# Patient Record
Sex: Female | Born: 1970 | Race: White | Hispanic: No | Marital: Single | Smoking: Former smoker
Health system: Southern US, Community
[De-identification: ages and names within clinical notes are randomized; demographics above are authoritative.]

## PROBLEM LIST (undated history)

## (undated) DIAGNOSIS — F419 Anxiety disorder, unspecified: Secondary | ICD-10-CM

## (undated) DIAGNOSIS — G4730 Sleep apnea, unspecified: Secondary | ICD-10-CM

## (undated) DIAGNOSIS — J449 Chronic obstructive pulmonary disease, unspecified: Secondary | ICD-10-CM

## (undated) DIAGNOSIS — K219 Gastro-esophageal reflux disease without esophagitis: Secondary | ICD-10-CM

## (undated) DIAGNOSIS — K5732 Diverticulitis of large intestine without perforation or abscess without bleeding: Secondary | ICD-10-CM

## (undated) DIAGNOSIS — I10 Essential (primary) hypertension: Secondary | ICD-10-CM

## (undated) DIAGNOSIS — M1990 Unspecified osteoarthritis, unspecified site: Secondary | ICD-10-CM

## (undated) DIAGNOSIS — E039 Hypothyroidism, unspecified: Secondary | ICD-10-CM

## (undated) DIAGNOSIS — J45909 Unspecified asthma, uncomplicated: Secondary | ICD-10-CM

## (undated) DIAGNOSIS — K449 Diaphragmatic hernia without obstruction or gangrene: Secondary | ICD-10-CM

## (undated) HISTORY — DX: Essential (primary) hypertension: I10

## (undated) HISTORY — DX: Diaphragmatic hernia without obstruction or gangrene: K44.9

## (undated) HISTORY — PX: ABDOMINAL HYSTERECTOMY: SHX81

## (undated) HISTORY — DX: Morbid (severe) obesity due to excess calories: E66.01

## (undated) HISTORY — PX: KNEE ARTHROSCOPY: SUR90

## (undated) HISTORY — PX: INNER EAR SURGERY: SHX679

## (undated) HISTORY — PX: COLONOSCOPY: SHX174

## (undated) HISTORY — PX: TONSILLECTOMY AND ADENOIDECTOMY: SUR1326

## (undated) HISTORY — DX: Sleep apnea, unspecified: G47.30

## (undated) HISTORY — PX: NASAL SINUS SURGERY: SHX719

## (undated) HISTORY — DX: Gastro-esophageal reflux disease without esophagitis: K21.9

## (undated) HISTORY — PX: HERNIA REPAIR: SHX51

---

## 2012-05-15 ENCOUNTER — Encounter (INDEPENDENT_AMBULATORY_CARE_PROVIDER_SITE_OTHER): Payer: Self-pay | Admitting: General Surgery

## 2012-05-15 ENCOUNTER — Ambulatory Visit (INDEPENDENT_AMBULATORY_CARE_PROVIDER_SITE_OTHER): Payer: 59 | Admitting: General Surgery

## 2012-05-15 DIAGNOSIS — K219 Gastro-esophageal reflux disease without esophagitis: Secondary | ICD-10-CM

## 2012-05-15 DIAGNOSIS — I10 Essential (primary) hypertension: Secondary | ICD-10-CM

## 2012-05-15 DIAGNOSIS — R635 Abnormal weight gain: Secondary | ICD-10-CM

## 2012-05-15 LAB — CBC WITH DIFFERENTIAL/PLATELET
Hemoglobin: 14.7 g/dL (ref 12.0–15.0)
Lymphocytes Relative: 20 % (ref 12–46)
Lymphs Abs: 2.3 10*3/uL (ref 0.7–4.0)
Monocytes Relative: 6 % (ref 3–12)
Neutro Abs: 8.3 10*3/uL — ABNORMAL HIGH (ref 1.7–7.7)
Neutrophils Relative %: 74 % (ref 43–77)
Platelets: 289 10*3/uL (ref 150–400)
RBC: 4.97 MIL/uL (ref 3.87–5.11)
WBC: 11.3 10*3/uL — ABNORMAL HIGH (ref 4.0–10.5)

## 2012-05-15 NOTE — Progress Notes (Signed)
Patient ID: [name], female   DOB: June 15, 1971, 41 y.o.   MRN: [id number]  Chief Complaint  Patient presents with  . Other    new bariatric- gastric sleeve initial    HPI [name] is a 41 y.o. female.   HPI This patient presents for her initial weight loss surgery evaluation. She has attended her information session and is interested in a sleeve gastrectomy. She has a BMI of 43 and a history of hypertension, knee pain, and herniated discs in her back. She also has gastroesophageal reflux. She has her heartburn every night when she lies flat. She was taking Nexium which worked well for her but her insurance wouldn't pay any more and so she has not been taking this. Currently she's been taking TUMS and some milk and this seems to do well for her reflux. She is troubled with her weight since 1997 after the birth of her child and has tried several over-the-counter medications for appetite control and has been followed by her doctor for the last 3 years for this as well. She has been off and on phentermine which has not really worked well for her. She says she works at twice a week at [employer] on the bicycle and elliptical and she says she walks 2 miles per day. She is not working currently she says she is disabled due to her back.  Past Medical History  Diagnosis Date  . GERD (gastroesophageal reflux disease)   . Hypertension   . Sleep apnea     Past Surgical History  Procedure Date  . Abdominal hysterectomy   . Knee arthroscopy     left  . Inner ear surgery     Family History  Problem Relation Age of Onset  . Cancer Father     lymphoma  . Cancer Paternal Grandmother     breast    Social History History  Substance Use Topics  . Smoking status: Former Games developer  . Smokeless tobacco: Not on file  . Alcohol Use: No    No Known Allergies  Current Outpatient Prescriptions  Medication Sig Dispense Refill  . esomeprazole (NEXIUM) 40 MG capsule Take 40 mg by mouth daily  before breakfast.      . fluticasone (VERAMYST) 27.5 MCG/SPRAY nasal spray Place 2 sprays into the nose daily.      . hydrochlorothiazide (HYDRODIURIL) 25 MG tablet Take 25 mg by mouth daily.      Marland Kitchen losartan (COZAAR) 25 MG tablet Take 25 mg by mouth daily.      . meloxicam (MOBIC) 15 MG tablet Take 15 mg by mouth daily.      Marland Kitchen oxyCODONE (ROXICODONE) 15 MG immediate release tablet Take 15 mg by mouth every 4 (four) hours as needed.      Marland Kitchen oxymorphone (OPANA ER) 30 MG 12 hr tablet Take 30 mg by mouth every 12 (twelve) hours.      . phentermine 37.5 MG capsule Take 37.5 mg by mouth every morning.        Review of Systems Review of Systems All other review of systems negative or noncontributory except as stated in the HPI  Blood pressure 150/102, pulse 84, temperature 96.9 F (36.1 C), temperature source Temporal, resp. rate 18, height 6\' 1"  (1.854 m), weight 333 lb 6.4 oz (151.229 kg).  Physical Exam Physical Exam Physical Exam  Vitals reviewed. Constitutional: He is oriented to person, place, and time. He appears well-developed and well-nourished. No distress.  HENT:  Head: Normocephalic and atraumatic.  Mouth/Throat: No oropharyngeal exudate.  Eyes: Conjunctivae and EOM are normal. Pupils are equal, round, and reactive to light. Right eye exhibits no discharge. Left eye exhibits no discharge. No scleral icterus.  Neck: Normal range of motion. No tracheal deviation present.  Cardiovascular: Normal rate, regular rhythm and normal heart sounds.   Pulmonary/Chest: Effort normal and breath sounds normal. No stridor. No respiratory distress. He has no wheezes. He has no rales. He exhibits no tenderness.  Abdominal: Soft. Bowel sounds are normal. He exhibits no distension and no mass. There is no tenderness. There is no rebound and no guarding.  Musculoskeletal: Normal range of motion. He exhibits no edema and no tenderness.  Neurological: He is alert and oriented to person, place, and time.    Skin: Skin is warm and dry. No rash noted. He is not diaphoretic. No erythema. No pallor.  Psychiatric: He has a normal mood and affect. His behavior is normal. Judgment and thought content normal.    Data Reviewed   Assessment    Morbid obesity with BMI of 43 and comorbidities of hypertension, back pain, knee pain, and GERD I discussed with her all the surgical options including Laband, vertical sleve gastrectomy, and Roux-en-Y gastric bypass. I think that she would qualify for any of these surgeries, however, I did discuss the increased risk of reflux or worsening reflux with a sleeve gastrectomy. She expressed understanding of this. She is still interested in pursuing sleeve gastrectomy and I explained that if her reflux is worsened and not controlled with medications postoperatively, then she will need conversion to gastric bypass. She expressed understanding of this.The risks of infection, bleeding, pain, scarring, weight regain, too little or too much weight loss, vitamin deficiencies and need for lifelong vitamin supplementation, hair loss, need for protein supplementation, leaks, stricture, reflux, food intolerance, need for reoperation and conversion to roux Y gastric bypass, need for open surgery, injury to spleen or surrounding structures, DVT's, PE, and death again discussed with the patient and the patient expressed understanding and desires to proceed with laparoscopic vertical sleeve gastrectomy, possible open, intraoperative endoscopy.She would like to proceed with sleeve gastrectomy. We spent 45 minutes reviewing the information.     Plan    We will check labs, UGI, nutrition and psychology evaluations.       Lodema Pilot DAVID 05/15/2012, 5:27 PM

## 2012-05-16 LAB — LIPID PANEL
HDL: 46 mg/dL (ref >39)
LDL Cholesterol: 115 mg/dL — ABNORMAL HIGH (ref 0–99)
Total CHOL/HDL Ratio: 4.2 Ratio
VLDL: 32 mg/dL (ref 0–40)

## 2012-05-16 LAB — TSH: TSH: 2.255 u[IU]/mL (ref 0.350–4.500)

## 2012-05-16 LAB — COMPREHENSIVE METABOLIC PANEL
ALT: 21 U/L (ref 0–35)
Albumin: 4.4 g/dL (ref 3.5–5.2)
CO2: 26 mEq/L (ref 19–32)
Glucose, Bld: 70 mg/dL (ref 70–99)
Potassium: 4.5 mEq/L (ref 3.5–5.3)
Sodium: 140 mEq/L (ref 135–145)
Total Protein: 7.5 g/dL (ref 6.0–8.3)

## 2012-05-16 LAB — T4: T4, Total: 10.6 ug/dL (ref 5.0–12.5)

## 2012-05-19 ENCOUNTER — Telehealth (INDEPENDENT_AMBULATORY_CARE_PROVIDER_SITE_OTHER): Payer: Self-pay

## 2012-05-20 ENCOUNTER — Telehealth (INDEPENDENT_AMBULATORY_CARE_PROVIDER_SITE_OTHER): Payer: Self-pay

## 2012-05-20 NOTE — Telephone Encounter (Signed)
H-pylori results given to patient, reviewed with Dr. Johna Sheriff- Prev Pac called in to [facility] on [address] [facility] [address]. [place], [place].  Patient will pick up RX after UGI on 05/21/12. Prev- Pac 1 po, b.i.d, 14 day's, #28 0 refills-Dr. Johna Sheriff authorized (Dr. [name] on vac).

## 2012-05-20 NOTE — Telephone Encounter (Signed)
Patient called regarding an UGI that is scheduled for 05/21/12, patient states she had an UGI last year at [facility] in [place].  Records requested on 05/15/12, patient called our office back and said she did not have an UGI it was and EGD.  Patient scheduled for UGI on 05/21/12 and has been instructed to keep this appointment.

## 2012-05-21 ENCOUNTER — Ambulatory Visit (HOSPITAL_COMMUNITY)
Admission: RE | Admit: 2012-05-21 | Discharge: 2012-05-21 | Disposition: A | Discharge status: Home / Self Care | Payer: Medicare Other | Source: Ambulatory Visit | Attending: General Surgery | Admitting: General Surgery

## 2012-05-21 DIAGNOSIS — G4730 Sleep apnea, unspecified: Secondary | ICD-10-CM | POA: Insufficient documentation

## 2012-05-21 DIAGNOSIS — M25569 Pain in unspecified knee: Secondary | ICD-10-CM | POA: Insufficient documentation

## 2012-05-21 DIAGNOSIS — K219 Gastro-esophageal reflux disease without esophagitis: Secondary | ICD-10-CM

## 2012-05-21 DIAGNOSIS — K449 Diaphragmatic hernia without obstruction or gangrene: Secondary | ICD-10-CM | POA: Insufficient documentation

## 2012-05-21 DIAGNOSIS — I10 Essential (primary) hypertension: Secondary | ICD-10-CM

## 2012-05-21 DIAGNOSIS — Z6841 Body Mass Index (BMI) 40.0 and over, adult: Secondary | ICD-10-CM | POA: Insufficient documentation

## 2012-05-21 DIAGNOSIS — R635 Abnormal weight gain: Secondary | ICD-10-CM

## 2012-05-21 DIAGNOSIS — M549 Dorsalgia, unspecified: Secondary | ICD-10-CM | POA: Insufficient documentation

## 2012-05-26 ENCOUNTER — Other Ambulatory Visit (INDEPENDENT_AMBULATORY_CARE_PROVIDER_SITE_OTHER): Payer: Self-pay

## 2012-05-26 ENCOUNTER — Other Ambulatory Visit (INDEPENDENT_AMBULATORY_CARE_PROVIDER_SITE_OTHER): Payer: Self-pay | Admitting: General Surgery

## 2012-05-26 DIAGNOSIS — A048 Other specified bacterial intestinal infections: Secondary | ICD-10-CM

## 2012-05-26 MED ORDER — AMOXICILL-CLARITHRO-LANSOPRAZ PO MISC: Freq: Two times a day (BID) | ORAL | Status: DC

## 2012-05-29 ENCOUNTER — Telehealth (INDEPENDENT_AMBULATORY_CARE_PROVIDER_SITE_OTHER): Payer: Self-pay

## 2012-05-29 NOTE — Telephone Encounter (Signed)
Lab order slip mailed to patient, [name] need's to have repeat H-Pylori Test 1 week after completion of Prev-Pac.

## 2012-06-04 ENCOUNTER — Encounter: Payer: 59 | Attending: General Surgery | Admitting: *Deleted

## 2012-06-04 DIAGNOSIS — Z713 Dietary counseling and surveillance: Secondary | ICD-10-CM | POA: Insufficient documentation

## 2012-06-04 DIAGNOSIS — Z01818 Encounter for other preprocedural examination: Secondary | ICD-10-CM | POA: Insufficient documentation

## 2012-06-04 NOTE — Progress Notes (Signed)
  Pre-Op Assessment Visit:  Pre-Operative Gastric Sleeve Surgery  Medical Nutrition Therapy:  Appt start time: 1500   End time:  1600.  Patient was seen on 06/04/12 for Pre-Operative Gastric Sleeve Nutrition Assessment. Assessment and letter of approval faxed to [facility] Bariatric Surgery Program coordinator on 06/05/2012.  Approval letter sent to [facility] Scan center and will be available in the chart under the media tab.  TANITA  BODY COMP RESULTS  06/04/12   %Fat 53.2%   Fat Mass (lbs) 174.0   Fat Free Mass (lbs) 153.5   Total Body Water (lbs) 112.5   Handouts given during visit include:  Pre-Op Goals   Bariatric Surgery Protein Shakes  Patient to call for Pre-Op and Post-Op Nutrition Education at [facility] when surgery is scheduled.

## 2012-06-04 NOTE — Patient Instructions (Addendum)
   Follow Pre-Op Nutrition Goals to prepare for Gastric Sleeve Surgery.   Call the [facility] at [phone] once you have been given your surgery date to enrolled in the Pre-Op Nutrition Class. You will need to attend this nutrition class 3-4 weeks prior to your surgery.  

## 2012-06-05 ENCOUNTER — Encounter: Payer: Self-pay | Admitting: *Deleted

## 2012-06-09 NOTE — Telephone Encounter (Signed)
error 

## 2012-06-12 ENCOUNTER — Other Ambulatory Visit (INDEPENDENT_AMBULATORY_CARE_PROVIDER_SITE_OTHER): Payer: Self-pay | Admitting: General Surgery

## 2012-06-13 LAB — H. PYLORI ANTIBODY, IGG: H Pylori IgG: 7.03 {ISR} — ABNORMAL HIGH

## 2012-06-17 ENCOUNTER — Telehealth (INDEPENDENT_AMBULATORY_CARE_PROVIDER_SITE_OTHER): Payer: Self-pay

## 2012-06-17 NOTE — Telephone Encounter (Signed)
Breath Tek scheduled for patient on 06/25/12 @ 7:45 am.  Information with date & time given to Katie to notify patient and send paperwork regarding Breath Tek Test to patient.

## 2012-06-25 ENCOUNTER — Ambulatory Visit (HOSPITAL_COMMUNITY)
Admission: RE | Admit: 2012-06-25 | Discharge: 2012-06-25 | Disposition: A | Discharge status: Home / Self Care | Payer: PRIVATE HEALTH INSURANCE | Source: Ambulatory Visit | Attending: General Surgery | Admitting: General Surgery

## 2012-06-25 ENCOUNTER — Encounter (HOSPITAL_COMMUNITY)
Admission: RE | Disposition: A | Discharge status: Home / Self Care | Payer: Self-pay | Source: Ambulatory Visit | Attending: General Surgery

## 2012-06-25 HISTORY — PX: BREATH TEK H PYLORI: SHX5422

## 2012-06-25 SURGERY — BREATH TEST, FOR HELICOBACTER PYLORI

## 2012-06-26 ENCOUNTER — Encounter (HOSPITAL_COMMUNITY): Payer: Self-pay

## 2012-06-26 ENCOUNTER — Encounter (HOSPITAL_COMMUNITY): Payer: Self-pay | Admitting: General Surgery

## 2012-07-09 ENCOUNTER — Encounter (HOSPITAL_COMMUNITY): Payer: Self-pay | Admitting: Psychiatry

## 2012-07-09 ENCOUNTER — Ambulatory Visit (INDEPENDENT_AMBULATORY_CARE_PROVIDER_SITE_OTHER): Payer: Medicare Other | Admitting: Psychiatry

## 2012-07-09 VITALS — BP 128/74 | HR 88 | Ht 73.5 in | Wt 330.4 lb

## 2012-07-09 DIAGNOSIS — I10 Essential (primary) hypertension: Secondary | ICD-10-CM

## 2012-07-09 DIAGNOSIS — F064 Anxiety disorder due to known physiological condition: Secondary | ICD-10-CM

## 2012-07-09 DIAGNOSIS — K219 Gastro-esophageal reflux disease without esophagitis: Secondary | ICD-10-CM | POA: Insufficient documentation

## 2012-07-09 DIAGNOSIS — E669 Obesity, unspecified: Secondary | ICD-10-CM

## 2012-07-09 DIAGNOSIS — G4730 Sleep apnea, unspecified: Secondary | ICD-10-CM

## 2012-07-09 DIAGNOSIS — F0630 Mood disorder due to known physiological condition, unspecified: Secondary | ICD-10-CM
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
## 2012-11-29 ENCOUNTER — Ambulatory Visit: Payer: Self-pay | Admitting: *Deleted

## 2012-12-04 ENCOUNTER — Ambulatory Visit (INDEPENDENT_AMBULATORY_CARE_PROVIDER_SITE_OTHER): Payer: 59 | Admitting: General Surgery

## 2012-12-11 ENCOUNTER — Encounter (HOSPITAL_COMMUNITY): Payer: Self-pay | Admitting: Pharmacy Technician

## 2012-12-11 ENCOUNTER — Other Ambulatory Visit (HOSPITAL_COMMUNITY): Payer: Self-pay | Admitting: General Surgery

## 2012-12-11 NOTE — Patient Instructions (Addendum)
22 [name]  12/11/2012   Your procedure is scheduled on: 12-23-2012  Report to [facility] at 0515 AM.  Call this number if you have problems the morning of surgery [phone]   Remember:   Do not eat food or drink liquids :After Midnight.     Take these medicines the morning of surgery with A SIP OF WATER: gabapentin                               SEE [facility] PREPARING FOR SURGERY SHEET   Do not wear jewelry, make-up or nail polish.  Do not wear lotions, powders, or perfumes. You may wear deodorant.   Men may shave face and neck.  Do not bring valuables to the hospital.  Contacts, dentures or bridgework may not be worn into surgery.  Leave suitcase in the car. After surgery it may be brought to your room.  For patients admitted to the hospital, checkout time is 11:00 AM the day of discharge.   Patients discharged the day of surgery will not be allowed to drive home.  Name and phone number of your driver:  Special Instructions: N/A   Please read over the following fact sheets that you were given: MRSA Information.  Call Cain Sieve RN pre op nurse if needed [phone]    FAILURE TO FOLLOW THESE INSTRUCTIONS MAY RESULT IN THE CANCELLATION OF YOUR SURGERY. PATIENT SIGNATURE___________________________________________

## 2012-12-12 ENCOUNTER — Ambulatory Visit (HOSPITAL_COMMUNITY)
Admission: RE | Admit: 2012-12-12 | Discharge: 2012-12-12 | Disposition: A | Discharge status: Home / Self Care | Payer: Medicare Other | Source: Ambulatory Visit | Attending: General Surgery | Admitting: General Surgery

## 2012-12-12 ENCOUNTER — Encounter (INDEPENDENT_AMBULATORY_CARE_PROVIDER_SITE_OTHER): Payer: Self-pay | Admitting: General Surgery

## 2012-12-12 ENCOUNTER — Encounter (HOSPITAL_COMMUNITY)
Admission: RE | Admit: 2012-12-12 | Discharge: 2012-12-12 | Disposition: A | Discharge status: Home / Self Care | Payer: Medicare Other | Source: Ambulatory Visit | Attending: General Surgery | Admitting: General Surgery

## 2012-12-12 ENCOUNTER — Encounter (HOSPITAL_COMMUNITY): Payer: Self-pay

## 2012-12-12 ENCOUNTER — Ambulatory Visit (INDEPENDENT_AMBULATORY_CARE_PROVIDER_SITE_OTHER): Payer: 59 | Admitting: General Surgery

## 2012-12-12 VITALS — BP 138/90 | HR 84 | Temp 97.2°F | Ht 73.0 in | Wt 322.2 lb

## 2012-12-12 VITALS — BP 155/90 | HR 77 | Temp 97.9°F | Resp 18 | Ht 73.0 in | Wt 324.2 lb

## 2012-12-12 DIAGNOSIS — E669 Obesity, unspecified: Secondary | ICD-10-CM

## 2012-12-12 DIAGNOSIS — I10 Essential (primary) hypertension: Secondary | ICD-10-CM | POA: Insufficient documentation

## 2012-12-12 DIAGNOSIS — Z01812 Encounter for preprocedural laboratory examination: Secondary | ICD-10-CM | POA: Insufficient documentation

## 2012-12-12 LAB — CBC WITH DIFFERENTIAL/PLATELET
Eosinophils Absolute: 0 10*3/uL (ref 0.0–0.7)
Hemoglobin: 14.6 g/dL (ref 12.0–15.0)
Lymphocytes Relative: 25 % (ref 12–46)
Lymphs Abs: 3.2 10*3/uL (ref 0.7–4.0)
MCH: 29.4 pg (ref 26.0–34.0)
Monocytes Relative: 6 % (ref 3–12)
Neutrophils Relative %: 68 % (ref 43–77)
RBC: 4.97 MIL/uL (ref 3.87–5.11)

## 2012-12-12 LAB — COMPREHENSIVE METABOLIC PANEL
Alkaline Phosphatase: 59 U/L (ref 39–117)
BUN: 18 mg/dL (ref 6–23)
CO2: 26 mEq/L (ref 19–32)
Chloride: 104 mEq/L (ref 96–112)
GFR calc Af Amer: >90 mL/min (ref >90)
GFR calc non Af Amer: >90 mL/min (ref >90)
Glucose, Bld: 93 mg/dL (ref 70–99)
Potassium: 4.8 mEq/L (ref 3.5–5.1)
Total Bilirubin: 0.3 mg/dL (ref 0.3–1.2)
Total Protein: 7.5 g/dL (ref 6.0–8.3)

## 2012-12-12 NOTE — Progress Notes (Signed)
Patient ID: [name], female   DOB: 01/12/1971, 42 y.o.   MRN: [id number]  Chief Complaint  Patient presents with  . Bariatric Pre-op    gastric sleeve    HPI [name] is a 42 y.o. female.  This patient presents for her preoperative surgical evaluation preparatory to laparoscopic vertical sleeve gastrectomy. She has seen a nutritionist a psychologist and has had her upper GI which demonstrated a small hernia and some mild reflux. She says that she has not been taking any Nexium or any tones in her reflux is actually been much better. She is starting her preoperative diet today. HPI  Past Medical History  Diagnosis Date  . GERD (gastroesophageal reflux disease)   . Hypertension   . Sleep apnea   . Hiatal hernia     Per pt on 06/04/12  . Morbid obesity     Past Surgical History  Procedure Date  . Abdominal hysterectomy   . Knee arthroscopy     left  . Inner ear surgery   . Breath tek h pylori 06/25/2012    Procedure: BREATH TEK H PYLORI;  Surgeon: Benjamin T Hoxworth, MD;  Location: [facility];  Service: General;  Laterality: N/A;  Dr. Armonii Sieh's patient    Family History  Problem Relation Age of Onset  . Cancer Father     lymphoma  . Cancer Paternal Grandmother     breast    Social History History  Substance Use Topics  . Smoking status: Former Smoker  . Smokeless tobacco: Not on file  . Alcohol Use: No    No Known Allergies  Current Outpatient Prescriptions  Medication Sig Dispense Refill  . hydrochlorothiazide (HYDRODIURIL) 25 MG tablet Take 25 mg by mouth daily.      . losartan (COZAAR) 50 MG tablet Take 50 mg by mouth daily before breakfast.      . meloxicam (MOBIC) 15 MG tablet Take 15 mg by mouth daily.      . valACYclovir (VALTREX) 1000 MG tablet       . aspirin EC 81 MG tablet Take 81 mg by mouth daily.      . gabapentin (NEURONTIN) 300 MG capsule Take 300 mg by mouth 2 (two) times daily.      . omeprazole (PRILOSEC) 40 MG capsule Take 40 mg by  mouth daily.      . phentermine 37.5 MG capsule Take 37.5 mg by mouth every morning.      . solifenacin (VESICARE) 5 MG tablet Take 10 mg by mouth daily.        Review of Systems Review of Systems All other review of systems negative or noncontributory except as stated in the HPI  Blood pressure 138/90, pulse 84, temperature 97.2 F (36.2 C), temperature source Temporal, height 6' 1" (1.854 m), weight 322 lb 3.2 oz (146.149 kg), SpO2 98.00%.  Physical Exam Physical Exam Physical Exam  Nursing note and vitals reviewed. Constitutional: She is oriented to person, place, and time. She appears well-developed and well-nourished. No distress.  HENT:  Head: Normocephalic and atraumatic.  Mouth/Throat: No oropharyngeal exudate.  Eyes: Conjunctivae and EOM are normal. Pupils are equal, round, and reactive to light. Right eye exhibits no discharge. Left eye exhibits no discharge. No scleral icterus.  Neck: Normal range of motion. Neck supple. No tracheal deviation present.  Cardiovascular: Normal rate, regular rhythm, normal heart sounds and intact distal pulses.   Pulmonary/Chest: Effort normal and breath sounds normal. No stridor. No respiratory distress.   She has no wheezes.  Abdominal: Soft. Bowel sounds are normal. She exhibits no distension and no mass. There is no tenderness. There is no rebound and no guarding.  Musculoskeletal: Normal range of motion. She exhibits no edema and no tenderness.  Neurological: She is alert and oriented to person, place, and time.  Skin: Skin is warm and dry. No rash noted. She is not diaphoretic. No erythema. No pallor.  Psychiatric: She has a normal mood and affect. Her behavior is normal. Judgment and thought content normal.    Data Reviewed   Assessment    Morbid obesity with a BMI of 42.5 with hypertension, reflux, sleep apnea and arthritis She is remaining interested in the sleeve gastrectomy and seems to be motivated. She has already been cleared  by the psychologist and the nutritionist and is scheduled for surgery in 2 weeks. She is going to start her preoperative diet today. We again discussed the options for weight loss including surgical and nonsurgical options. She remains interested in the sleeve gastrectomy. The risks of infection, bleeding, pain, scarring, weight regain, too little or too much weight loss, vitamin deficiencies and need for lifelong vitamin supplementation, hair loss, need for protein supplementation, leaks, stricture, reflux, food intolerance, need for reoperation and conversion to roux Y gastric bypass, need for open surgery, injury to spleen or surrounding structures, DVT's, PE, and death again discussed with the patient and the patient expressed understanding and desires to proceed with laparoscopic vertical sleeve gastrectomy, possible open, intraoperative endoscopy.     Plan    Will plan for sleeve gastrectomy       Daianna Vasques DAVID 12/12/2012, 9:49 AM    

## 2012-12-17 ENCOUNTER — Encounter: Payer: Self-pay | Admitting: *Deleted

## 2012-12-17 ENCOUNTER — Encounter: Payer: PRIVATE HEALTH INSURANCE | Attending: General Surgery | Admitting: *Deleted

## 2012-12-17 VITALS — Ht 73.0 in | Wt 316.5 lb

## 2012-12-17 DIAGNOSIS — E669 Obesity, unspecified: Secondary | ICD-10-CM

## 2012-12-17 DIAGNOSIS — Z01818 Encounter for other preprocedural examination: Secondary | ICD-10-CM | POA: Insufficient documentation

## 2012-12-17 DIAGNOSIS — Z713 Dietary counseling and surveillance: Secondary | ICD-10-CM | POA: Insufficient documentation

## 2012-12-17 NOTE — Progress Notes (Addendum)
  Pre-Operative Nutrition Visit:  Appt start time: 0900   End time:  1000.  Patient was seen on 12/17/2012 for Pre-Operative Bariatric Surgery Education at [facility].   Surgery date: 12/23/12 Surgery type: Gastric Sleeve Start weight at [facility]: 327.5 lbs (06/04/12)  Weight today: 316.5 lbs Weight change: 11.0 lbs Total weight lost: 11.0 lbs  Goal weight: 200 lbs % goal met: N/A  TANITA  BODY COMP RESULTS  06/04/12 12/17/12   BMI (kg/m^2) 43.2 41.8   Fat Mass (lbs) 174.0 178.5   Fat Free Mass (lbs) 153.5 138.0   Total Body Water (lbs) 112.5 101.0   Samples given per MNT protocol: Bariatric Advantage Multivitamin Lot # [id number]; Exp: 06/15 Lot # [id number]; Exp: 06/15 Lot # [id number]; Exp: 10/15  Bariatric Advantage Sublingual B12 Lot # [id number]; Exp: 10/15  Celebrate Vitamins Multivitamin Lot # [id number]; Exp: 11/14 Lot # [id number]; Exp: 06/15 Lot # [id number]; Exp: 07/15 Lot # [id number]; Exp: 01/15 Lot # [id number]; Exp: 07/15  Celebrate Vitamins Calcium Citrate Lot # [id number]; Exp: 08/15 Lot # [id number]; Exp: 09/15  Dyke Brackett MVI: 2 bottles @ 5 tabs/bottle Lot # [id number]; Exp: 01/17  Unjury Protein Powder: 1 ea of the following  Lot # [id number]; Exp: 06/15 Lot # [id number]; Exp: 02/15 Lot # [id number]; Exp: 06/15 Lot # [id number]; Exp: 03/15 Lot # [id number]; Exp: 02/15  Premier Protein Shake: 1 ea Lot # [id number]; Exp: 09/26/13  The following the learning objective met by the patient during this course:  Identifies Pre-Op Dietary Goals and will begin 2 weeks pre-operatively  Identifies appropriate sources of fluids and proteins   States protein recommendations and appropriate sources pre and post-operatively  Identifies Post-Operative Dietary Goals and will follow for 2 weeks post-operatively  Identifies appropriate multivitamin and calcium sources  Describes the need for physical activity post-operatively and will follow MD recommendations  States when to call healthcare  provider regarding medication questions or post-operative complications  Handouts given during class include:  Pre-Op Bariatric Surgery Diet Handout  Protein Shake Handout  Post-Op Bariatric Surgery Nutrition Handout  BELT Program Information Flyer  Support Group Information Flyer  [facility] Bariatric Supplements Price List  Follow-Up Plan: Patient will follow-up at [facility] 2 weeks post operatively for diet advancement per MD.

## 2012-12-17 NOTE — Patient Instructions (Addendum)
Follow:   Pre-Op Diet per MD 2 weeks prior to surgery  Phase 2- Liquids (clear/full) 2 weeks after surgery  Vitamin/Mineral/Calcium guidelines for purchasing bariatric supplements  Exercise guidelines pre and post-op per MD  Per Dr. [name], only liquids for 24 hours prior to surgery.  Follow-up at [facility] in 2 weeks post-op for diet advancement. Contact Pauline Trainer as needed with questions/concerns.

## 2012-12-22 NOTE — Anesthesia Preprocedure Evaluation (Addendum)
Anesthesia Evaluation  Patient identified by MRN, date of birth, ID band Patient awake    Reviewed: Allergy & Precautions, H&P , NPO status , Patient's Chart, lab work & pertinent test results  Airway Mallampati: II TM Distance: >3 FB Neck ROM: full    Dental No notable dental hx. (+) Teeth Intact and Dental Advisory Given   Pulmonary neg pulmonary ROS, sleep apnea ,  breath sounds clear to auscultation  Pulmonary exam normal       Cardiovascular hypertension, Pt. on medications Rhythm:regular Rate:Normal     Neuro/Psych negative neurological ROS  negative psych ROS   GI/Hepatic negative GI ROS, Neg liver ROS, hiatal hernia, GERD-  Medicated and Controlled,  Endo/Other  negative endocrine ROSMorbid obesity  Renal/GU negative Renal ROS  negative genitourinary   Musculoskeletal   Abdominal   Peds  Hematology negative hematology ROS (+)   Anesthesia Other Findings   Reproductive/Obstetrics negative OB ROS                          Anesthesia Physical Anesthesia Plan  ASA: III  Anesthesia Plan: General   Post-op Pain Management:    Induction: Intravenous  Airway Management Planned: Oral ETT  Additional Equipment:   Intra-op Plan:   Post-operative Plan: Extubation in OR  Informed Consent: I have reviewed the patients History and Physical, chart, labs and discussed the procedure including the risks, benefits and alternatives for the proposed anesthesia with the patient or authorized representative who has indicated his/her understanding and acceptance.   Dental Advisory Given  Plan Discussed with: CRNA and Surgeon  Anesthesia Plan Comments:         Anesthesia Quick Evaluation

## 2012-12-23 ENCOUNTER — Encounter (HOSPITAL_COMMUNITY)
Admission: RE | Disposition: A | Discharge status: Home / Self Care | Payer: Self-pay | Source: Ambulatory Visit | Attending: General Surgery

## 2012-12-23 ENCOUNTER — Inpatient Hospital Stay (HOSPITAL_COMMUNITY)
Admission: RE | Admit: 2012-12-23 | Discharge: 2012-12-25 | DRG: 621 | Disposition: A | Discharge status: Home / Self Care | Payer: PRIVATE HEALTH INSURANCE | Source: Ambulatory Visit | Attending: General Surgery | Admitting: General Surgery

## 2012-12-23 ENCOUNTER — Inpatient Hospital Stay (HOSPITAL_COMMUNITY): Payer: PRIVATE HEALTH INSURANCE | Admitting: Anesthesiology

## 2012-12-23 ENCOUNTER — Encounter (HOSPITAL_COMMUNITY): Payer: Self-pay | Admitting: *Deleted

## 2012-12-23 ENCOUNTER — Encounter (HOSPITAL_COMMUNITY): Payer: Self-pay | Admitting: Anesthesiology

## 2012-12-23 DIAGNOSIS — G4733 Obstructive sleep apnea (adult) (pediatric): Secondary | ICD-10-CM

## 2012-12-23 DIAGNOSIS — K219 Gastro-esophageal reflux disease without esophagitis: Secondary | ICD-10-CM | POA: Diagnosis present

## 2012-12-23 DIAGNOSIS — I10 Essential (primary) hypertension: Secondary | ICD-10-CM

## 2012-12-23 DIAGNOSIS — Z6841 Body Mass Index (BMI) 40.0 and over, adult: Secondary | ICD-10-CM

## 2012-12-23 DIAGNOSIS — E669 Obesity, unspecified: Secondary | ICD-10-CM

## 2012-12-23 DIAGNOSIS — K449 Diaphragmatic hernia without obstruction or gangrene: Secondary | ICD-10-CM | POA: Diagnosis present

## 2012-12-23 HISTORY — PX: LAPAROSCOPIC GASTRIC SLEEVE RESECTION: SHX5895

## 2012-12-23 HISTORY — PX: ESOPHAGOGASTRODUODENOSCOPY: SHX5428

## 2012-12-23 SURGERY — GASTRECTOMY, SLEEVE, LAPAROSCOPIC
Anesthesia: General | Site: Esophagus | Wound class: Clean Contaminated

## 2012-12-23 MED ORDER — CISATRACURIUM BESYLATE (PF) 10 MG/5ML IV SOLN
INTRAVENOUS | Status: DC | PRN
  Administered 2012-12-23: 6 mg via INTRAVENOUS
  Administered 2012-12-23: 14 mg via INTRAVENOUS

## 2012-12-23 MED ORDER — PROMETHAZINE HCL 25 MG/ML IJ SOLN
12.5000 mg | INTRAMUSCULAR | Status: DC | PRN
  Administered 2012-12-23: 12.5 mg via INTRAVENOUS

## 2012-12-23 MED ORDER — ONDANSETRON HCL 4 MG/2ML IJ SOLN
INTRAMUSCULAR | Status: DC | PRN
  Administered 2012-12-23: 4 mg via INTRAVENOUS

## 2012-12-23 MED ORDER — GABAPENTIN 300 MG PO CAPS
300.0000 mg | ORAL_CAPSULE | Freq: Two times a day (BID) | ORAL | Status: DC
  Administered 2012-12-24: 300 mg via ORAL
  Filled 2012-12-23 (×4): qty 1

## 2012-12-23 MED ORDER — SODIUM CHLORIDE 0.9 % IV SOLN
1.0000 g | INTRAVENOUS | Status: AC
  Administered 2012-12-23: 1 g via INTRAVENOUS

## 2012-12-23 MED ORDER — ONDANSETRON HCL 4 MG/2ML IJ SOLN
4.0000 mg | INTRAMUSCULAR | Status: DC | PRN
  Administered 2012-12-23: 4 mg via INTRAVENOUS
  Filled 2012-12-23: qty 2

## 2012-12-23 MED ORDER — LIDOCAINE-EPINEPHRINE 1 %-1:100000 IJ SOLN
INTRAMUSCULAR | Status: AC
  Filled 2012-12-23: qty 1

## 2012-12-23 MED ORDER — PANTOPRAZOLE SODIUM 40 MG PO TBEC
40.0000 mg | DELAYED_RELEASE_TABLET | Freq: Every day | ORAL | Status: DC
  Administered 2012-12-24: 40 mg via ORAL
  Filled 2012-12-23 (×2): qty 1

## 2012-12-23 MED ORDER — OXYCODONE-ACETAMINOPHEN 5-325 MG/5ML PO SOLN
5.0000 mL | ORAL | Status: DC | PRN
  Administered 2012-12-24: 5 mL via ORAL
  Administered 2012-12-24: 10 mL via ORAL
  Administered 2012-12-25 (×2): 5 mL via ORAL
  Filled 2012-12-23 (×2): qty 5
  Filled 2012-12-23: qty 10
  Filled 2012-12-23: qty 5

## 2012-12-23 MED ORDER — BUPIVACAINE HCL (PF) 0.25 % IJ SOLN
INTRAMUSCULAR | Status: AC
  Filled 2012-12-23: qty 30

## 2012-12-23 MED ORDER — ENOXAPARIN SODIUM 40 MG/0.4ML ~~LOC~~ SOLN
40.0000 mg | Freq: Two times a day (BID) | SUBCUTANEOUS | Status: DC
  Administered 2012-12-24 – 2012-12-25 (×3): 40 mg via SUBCUTANEOUS
  Filled 2012-12-23 (×5): qty 0.4

## 2012-12-23 MED ORDER — LACTATED RINGERS IR SOLN
Status: DC | PRN
  Administered 2012-12-23: 3000 mL

## 2012-12-23 MED ORDER — KCL IN DEXTROSE-NACL 20-5-0.45 MEQ/L-%-% IV SOLN
INTRAVENOUS | Status: DC
  Administered 2012-12-23 – 2012-12-25 (×6): via INTRAVENOUS
  Filled 2012-12-23 (×8): qty 1000

## 2012-12-23 MED ORDER — BUPIVACAINE HCL 0.25 % IJ SOLN
INTRAMUSCULAR | Status: DC | PRN
  Administered 2012-12-23: 15 mL

## 2012-12-23 MED ORDER — KETOROLAC TROMETHAMINE 30 MG/ML IJ SOLN
30.0000 mg | Freq: Four times a day (QID) | INTRAMUSCULAR | Status: DC | PRN
  Administered 2012-12-23 – 2012-12-24 (×4): 30 mg via INTRAVENOUS
  Filled 2012-12-23 (×4): qty 1

## 2012-12-23 MED ORDER — LACTATED RINGERS IV SOLN
INTRAVENOUS | Status: DC | PRN
  Administered 2012-12-23 (×2): via INTRAVENOUS

## 2012-12-23 MED ORDER — GLYCOPYRROLATE 0.2 MG/ML IJ SOLN
INTRAMUSCULAR | Status: DC | PRN
  Administered 2012-12-23: 0.6 mg via INTRAVENOUS

## 2012-12-23 MED ORDER — PROPOFOL 10 MG/ML IV BOLUS
INTRAVENOUS | Status: DC | PRN
  Administered 2012-12-23: 200 mg via INTRAVENOUS

## 2012-12-23 MED ORDER — DEXAMETHASONE SODIUM PHOSPHATE 10 MG/ML IJ SOLN
INTRAMUSCULAR | Status: DC | PRN
  Administered 2012-12-23: 10 mg via INTRAVENOUS

## 2012-12-23 MED ORDER — HYDROMORPHONE HCL PF 1 MG/ML IJ SOLN
0.2500 mg | INTRAMUSCULAR | Status: DC | PRN
  Administered 2012-12-23 (×2): 0.5 mg via INTRAVENOUS

## 2012-12-23 MED ORDER — HYDROMORPHONE HCL PF 1 MG/ML IJ SOLN
INTRAMUSCULAR | Status: AC
  Filled 2012-12-23: qty 1

## 2012-12-23 MED ORDER — MIDAZOLAM HCL 5 MG/5ML IJ SOLN
INTRAMUSCULAR | Status: DC | PRN
  Administered 2012-12-23: 2 mg via INTRAVENOUS

## 2012-12-23 MED ORDER — HEPARIN SODIUM (PORCINE) 5000 UNIT/ML IJ SOLN
5000.0000 [IU] | Freq: Once | INTRAMUSCULAR | Status: AC
  Administered 2012-12-23: 5000 [IU] via SUBCUTANEOUS
  Filled 2012-12-23: qty 1

## 2012-12-23 MED ORDER — UNJURY CHOCOLATE CLASSIC POWDER: 2.0000 [oz_av] | Freq: Four times a day (QID) | ORAL | Status: DC

## 2012-12-23 MED ORDER — LOSARTAN POTASSIUM 50 MG PO TABS
50.0000 mg | ORAL_TABLET | Freq: Every day | ORAL | Status: DC
  Administered 2012-12-24 – 2012-12-25 (×2): 50 mg via ORAL
  Filled 2012-12-23 (×2): qty 1

## 2012-12-23 MED ORDER — PANTOPRAZOLE SODIUM 40 MG IV SOLR
40.0000 mg | Freq: Once | INTRAVENOUS | Status: AC
  Administered 2012-12-23: 40 mg via INTRAVENOUS
  Filled 2012-12-23: qty 40

## 2012-12-23 MED ORDER — ACETAMINOPHEN 10 MG/ML IV SOLN
INTRAVENOUS | Status: DC | PRN
  Administered 2012-12-23: 1000 mg via INTRAVENOUS

## 2012-12-23 MED ORDER — SUFENTANIL CITRATE 50 MCG/ML IV SOLN
INTRAVENOUS | Status: DC | PRN
  Administered 2012-12-23 (×2): 10 ug via INTRAVENOUS
  Administered 2012-12-23: 15 ug via INTRAVENOUS
  Administered 2012-12-23 (×2): 10 ug via INTRAVENOUS
  Administered 2012-12-23 (×3): 15 ug via INTRAVENOUS

## 2012-12-23 MED ORDER — ACETAMINOPHEN 160 MG/5ML PO SOLN: 650.0000 mg | ORAL | Status: DC | PRN

## 2012-12-23 MED ORDER — LIDOCAINE-EPINEPHRINE 1 %-1:100000 IJ SOLN
INTRAMUSCULAR | Status: DC | PRN
  Administered 2012-12-23: 15 mL

## 2012-12-23 MED ORDER — MORPHINE SULFATE 2 MG/ML IJ SOLN
2.0000 mg | INTRAMUSCULAR | Status: DC | PRN
  Administered 2012-12-23 – 2012-12-24 (×5): 4 mg via INTRAVENOUS
  Filled 2012-12-23: qty 2
  Filled 2012-12-23: qty 3
  Filled 2012-12-23 (×3): qty 2

## 2012-12-23 MED ORDER — TISSEEL VH 10 ML EX KIT
PACK | CUTANEOUS | Status: DC | PRN
  Administered 2012-12-23: 10 mL

## 2012-12-23 MED ORDER — PROMETHAZINE HCL 25 MG/ML IJ SOLN
INTRAMUSCULAR | Status: AC
  Filled 2012-12-23: qty 1

## 2012-12-23 MED ORDER — UNJURY CHICKEN SOUP POWDER: 2.0000 [oz_av] | Freq: Four times a day (QID) | ORAL | Status: DC

## 2012-12-23 MED ORDER — LACTATED RINGERS IV SOLN: INTRAVENOUS | Status: DC

## 2012-12-23 MED ORDER — UNJURY VANILLA POWDER
2.0000 [oz_av] | Freq: Four times a day (QID) | ORAL | Status: DC
  Administered 2012-12-25: 2 [oz_av] via ORAL

## 2012-12-23 MED ORDER — TISSEEL VH 10 ML EX KIT
PACK | CUTANEOUS | Status: AC
  Filled 2012-12-23: qty 1

## 2012-12-23 MED ORDER — NEOSTIGMINE METHYLSULFATE 1 MG/ML IJ SOLN
INTRAMUSCULAR | Status: DC | PRN
  Administered 2012-12-23: 5 mg via INTRAVENOUS

## 2012-12-23 MED ORDER — LIDOCAINE HCL (CARDIAC) 20 MG/ML IV SOLN
INTRAVENOUS | Status: DC | PRN
  Administered 2012-12-23: 100 mg via INTRAVENOUS

## 2012-12-23 SURGICAL SUPPLY — 58 items
APPLICATOR COTTON TIP 6IN STRL (MISCELLANEOUS) ×3 IMPLANT
APPLIER CLIP ROT 10 11.4 M/L (STAPLE) ×6
CABLE HIGH FREQUENCY MONO STRZ (ELECTRODE) ×3 IMPLANT
CANISTER SUCTION 2500CC (MISCELLANEOUS) ×3 IMPLANT
CANNULA 5.75X7 CRYSTAL CLEAR (CANNULA) ×3 IMPLANT
CHLORAPREP W/TINT 26ML (MISCELLANEOUS) ×6 IMPLANT
CLIP APPLIE ROT 10 11.4 M/L (STAPLE) ×4 IMPLANT
CLOTH BEACON ORANGE TIMEOUT ST (SAFETY) ×3 IMPLANT
COVER SURGICAL LIGHT HANDLE (MISCELLANEOUS) ×3 IMPLANT
DERMABOND ADVANCED (GAUZE/BANDAGES/DRESSINGS) ×2
DERMABOND ADVANCED .7 DNX12 (GAUZE/BANDAGES/DRESSINGS) ×4 IMPLANT
DEVICE SUTURE ENDOST 10MM (ENDOMECHANICALS) IMPLANT
DRAIN CHANNEL 19F RND (DRAIN) ×3 IMPLANT
DRAPE LAPAROSCOPIC ABDOMINAL (DRAPES) ×3 IMPLANT
ELECT REM PT RETURN 9FT ADLT (ELECTROSURGICAL) ×3
ELECTRODE REM PT RTRN 9FT ADLT (ELECTROSURGICAL) ×2 IMPLANT
EVACUATOR DRAINAGE 10X20 100CC (DRAIN) ×2 IMPLANT
EVACUATOR SILICONE 100CC (DRAIN) ×1
GLOVE SURG SS PI 7.5 STRL IVOR (GLOVE) ×6 IMPLANT
GOWN STRL NON-REIN LRG LVL3 (GOWN DISPOSABLE) ×3 IMPLANT
GOWN STRL REIN XL XLG (GOWN DISPOSABLE) ×9 IMPLANT
HANDLE STAPLE EGIA 4 XL (STAPLE) IMPLANT
HOVERMATT SINGLE USE (MISCELLANEOUS) ×3 IMPLANT
KIT BASIN OR (CUSTOM PROCEDURE TRAY) ×3 IMPLANT
KIT GASTRIC LAVAGE 34FR ADT (SET/KITS/TRAYS/PACK) ×3 IMPLANT
MARKER SKIN DUAL TIP RULER LAB (MISCELLANEOUS) ×3 IMPLANT
NEEDLE SPNL 22GX3.5 QUINCKE BK (NEEDLE) ×3 IMPLANT
NS IRRIG 1000ML POUR BTL (IV SOLUTION) ×3 IMPLANT
PENCIL BUTTON HOLSTER BLD 10FT (ELECTRODE) ×3 IMPLANT
POUCH SPECIMEN RETRIEVAL 10MM (ENDOMECHANICALS) IMPLANT
RELOAD BLACK 60MM ECHELON (STAPLE) IMPLANT
RELOAD EGIA 60 MED/THCK PURPLE (STAPLE) ×12 IMPLANT
RELOAD GREEN (STAPLE) IMPLANT
RELOAD TRI 2.0 60 XTHK VAS SUL (STAPLE) ×6 IMPLANT
SCISSORS LAP 5X35 DISP (ENDOMECHANICALS) IMPLANT
SEALANT SURGICAL APPL DUAL CAN (MISCELLANEOUS) ×9 IMPLANT
SET IRRIG TUBING LAPAROSCOPIC (IRRIGATION / IRRIGATOR) ×3 IMPLANT
SHEARS CURVED HARMONIC AC 45CM (MISCELLANEOUS) ×3 IMPLANT
SLEEVE XCEL OPT CAN 5 100 (ENDOMECHANICALS) ×6 IMPLANT
SOLUTION ANTI FOG 6CC (MISCELLANEOUS) ×3 IMPLANT
SPONGE DRAIN TRACH 4X4 STRL 2S (GAUZE/BANDAGES/DRESSINGS) ×6 IMPLANT
SPONGE GAUZE 4X4 12PLY (GAUZE/BANDAGES/DRESSINGS) IMPLANT
SPONGE LAP 18X18 X RAY DECT (DISPOSABLE) ×3 IMPLANT
STAPLE ECHEON FLEX 60 POW ENDO (STAPLE) IMPLANT
STAPLER VISISTAT 35W (STAPLE) ×3 IMPLANT
STRIP PERI DRY VERITAS 60 (STAPLE) IMPLANT
SUT ETHILON 2 0 PS N (SUTURE) ×3 IMPLANT
SUT MNCRL AB 4-0 PS2 18 (SUTURE) ×6 IMPLANT
SUT VICRYL 0 UR6 27IN ABS (SUTURE) ×3 IMPLANT
SYR 50ML LL SCALE MARK (SYRINGE) ×3 IMPLANT
TAPE CLOTH SURG 4X10 WHT LF (GAUZE/BANDAGES/DRESSINGS) ×3 IMPLANT
TRAY FOLEY CATH 14FRSI W/METER (CATHETERS) ×3 IMPLANT
TRAY LAP CHOLE (CUSTOM PROCEDURE TRAY) ×3 IMPLANT
TROCAR BLADELESS 15MM (ENDOMECHANICALS) ×3 IMPLANT
TROCAR BLADELESS OPT 5 100 (ENDOMECHANICALS) ×3 IMPLANT
TUBING CONNECTING 10 (TUBING) ×3 IMPLANT
TUBING ENDO SMARTCAP (MISCELLANEOUS) ×3 IMPLANT
TUBING FILTER THERMOFLATOR (ELECTROSURGICAL) ×3 IMPLANT

## 2012-12-23 NOTE — Brief Op Note (Signed)
12/23/2012  9:20 AM  PATIENT:  [name]  42 y.o. female  PRE-OPERATIVE DIAGNOSIS:  morbid obesity  POST-OPERATIVE DIAGNOSIS:  morbid obesity  PROCEDURE:  Procedure(s) (LRB) with comments: LAPAROSCOPIC GASTRIC SLEEVE RESECTION (N/A) ESOPHAGOGASTRODUODENOSCOPY (EGD) (N/A)  SURGEON:  Surgeon(s) and Role:    * Lodema Pilot, DO - Primary    * Mariella Saa, MD - Assisting  PHYSICIAN ASSISTANT:   ASSISTANTS: Hoxworth   ANESTHESIA:   general  EBL:  Total I/O In: 1000 [I.V.:1000] Out: 125 [Urine:125]  BLOOD ADMINISTERED:none  DRAINS: (73F) Jackson-Pratt drain(s) with closed bulb suction in the along the sleeve staple line   LOCAL MEDICATIONS USED:  MARCAINE    and LIDOCAINE   SPECIMEN:  Source of Specimen:  greater curve of stomach  DISPOSITION OF SPECIMEN:  PATHOLOGY  COUNTS:  YES  TOURNIQUET:  * No tourniquets in log *  DICTATION: .Other Dictation: Dictation Number  [id number]  PLAN OF CARE: Admit to inpatient   PATIENT DISPOSITION:  PACU - hemodynamically stable.   Delay start of Pharmacological VTE agent (>24hrs) due to surgical blood loss or risk of bleeding: no

## 2012-12-23 NOTE — H&P (View-Only) (Signed)
Patient ID: [name], female   DOB: 1971/11/06, 42 y.o.   MRN: [id number]  Chief Complaint  Patient presents with  . Bariatric Pre-op    gastric sleeve    HPI [name] is a 42 y.o. female.  This patient presents for her preoperative surgical evaluation preparatory to laparoscopic vertical sleeve gastrectomy. She has seen a nutritionist a psychologist and has had her upper GI which demonstrated a small hernia and some mild reflux. She says that she has not been taking any Nexium or any tones in her reflux is actually been much better. She is starting her preoperative diet today. HPI  Past Medical History  Diagnosis Date  . GERD (gastroesophageal reflux disease)   . Hypertension   . Sleep apnea   . Hiatal hernia     Per pt on 06/04/12  . Morbid obesity     Past Surgical History  Procedure Date  . Abdominal hysterectomy   . Knee arthroscopy     left  . Inner ear surgery   . Breath tek h pylori 06/25/2012    Procedure: BREATH TEK H PYLORI;  Surgeon: Mariella Saa, MD;  Location: [facility];  Service: General;  Laterality: N/A;  Dr. Delice Lesch patient    Family History  Problem Relation Age of Onset  . Cancer Father     lymphoma  . Cancer Paternal Grandmother     breast    Social History History  Substance Use Topics  . Smoking status: Former Games developer  . Smokeless tobacco: Not on file  . Alcohol Use: No    No Known Allergies  Current Outpatient Prescriptions  Medication Sig Dispense Refill  . hydrochlorothiazide (HYDRODIURIL) 25 MG tablet Take 25 mg by mouth daily.      Marland Kitchen losartan (COZAAR) 50 MG tablet Take 50 mg by mouth daily before breakfast.      . meloxicam (MOBIC) 15 MG tablet Take 15 mg by mouth daily.      . valACYclovir (VALTREX) 1000 MG tablet       . aspirin EC 81 MG tablet Take 81 mg by mouth daily.      Marland Kitchen gabapentin (NEURONTIN) 300 MG capsule Take 300 mg by mouth 2 (two) times daily.      Marland Kitchen omeprazole (PRILOSEC) 40 MG capsule Take 40 mg by  mouth daily.      . phentermine 37.5 MG capsule Take 37.5 mg by mouth every morning.      . solifenacin (VESICARE) 5 MG tablet Take 10 mg by mouth daily.        Review of Systems Review of Systems All other review of systems negative or noncontributory except as stated in the HPI  Blood pressure 138/90, pulse 84, temperature 97.2 F (36.2 C), temperature source Temporal, height 6\' 1"  (1.854 m), weight 322 lb 3.2 oz (146.149 kg), SpO2 98.00%.  Physical Exam Physical Exam Physical Exam  Nursing note and vitals reviewed. Constitutional: She is oriented to person, place, and time. She appears well-developed and well-nourished. No distress.  HENT:  Head: Normocephalic and atraumatic.  Mouth/Throat: No oropharyngeal exudate.  Eyes: Conjunctivae and EOM are normal. Pupils are equal, round, and reactive to light. Right eye exhibits no discharge. Left eye exhibits no discharge. No scleral icterus.  Neck: Normal range of motion. Neck supple. No tracheal deviation present.  Cardiovascular: Normal rate, regular rhythm, normal heart sounds and intact distal pulses.   Pulmonary/Chest: Effort normal and breath sounds normal. No stridor. No respiratory distress.  She has no wheezes.  Abdominal: Soft. Bowel sounds are normal. She exhibits no distension and no mass. There is no tenderness. There is no rebound and no guarding.  Musculoskeletal: Normal range of motion. She exhibits no edema and no tenderness.  Neurological: She is alert and oriented to person, place, and time.  Skin: Skin is warm and dry. No rash noted. She is not diaphoretic. No erythema. No pallor.  Psychiatric: She has a normal mood and affect. Her behavior is normal. Judgment and thought content normal.    Data Reviewed   Assessment    Morbid obesity with a BMI of 42.5 with hypertension, reflux, sleep apnea and arthritis She is remaining interested in the sleeve gastrectomy and seems to be motivated. She has already been cleared  by the psychologist and the nutritionist and is scheduled for surgery in 2 weeks. She is going to start her preoperative diet today. We again discussed the options for weight loss including surgical and nonsurgical options. She remains interested in the sleeve gastrectomy. The risks of infection, bleeding, pain, scarring, weight regain, too little or too much weight loss, vitamin deficiencies and need for lifelong vitamin supplementation, hair loss, need for protein supplementation, leaks, stricture, reflux, food intolerance, need for reoperation and conversion to roux Y gastric bypass, need for open surgery, injury to spleen or surrounding structures, DVT's, PE, and death again discussed with the patient and the patient expressed understanding and desires to proceed with laparoscopic vertical sleeve gastrectomy, possible open, intraoperative endoscopy.     Plan    Will plan for sleeve gastrectomy       Alpa Salvo DAVID 12/12/2012, 9:49 AM

## 2012-12-23 NOTE — Interval H&P Note (Signed)
History and Physical Interval Note:  12/23/2012 7:08 AM  Leland Her  has presented today for surgery, with the diagnosis of morbid obesity  The various methods of treatment have been discussed with the patient and family. After consideration of risks, benefits and other options for treatment, the patient has consented to  Procedure(s) (LRB) with comments: LAPAROSCOPIC GASTRIC SLEEVE RESECTION (N/A) ESOPHAGOGASTRODUODENOSCOPY (EGD) (N/A) as a surgical intervention .  The patient's history has been reviewed, patient examined, no change in status, stable for surgery.  I have reviewed the patient's chart and labs.  Questions were answered to the patient's satisfaction.   She was seen and examined in the preop area.  Risks of the procedure again discussed in lay terms.  She understands the risk of possible worsening reflux and need for possible conversion to RYGB.  The risks of infection, bleeding, pain, scarring, weight regain, too little or too much weight loss, vitamin deficiencies and need for lifelong vitamin supplementation, hair loss, need for protein supplementation, leaks, stricture, reflux, food intolerance, need for reoperation and conversion to roux Y gastric bypass, need for open surgery, injury to spleen or surrounding structures, DVT's, PE, and death again discussed with the patient and the patient expressed understanding and desires to proceed with laparoscopic vertical sleeve gastrectomy, possible open, intraoperative endoscopy.   Lodema Pilot DAVID

## 2012-12-23 NOTE — Op Note (Signed)
[name], [name] NO.:  [id number]  MEDICAL RECORD NO.:  [id number]  LOCATION:  [facility]                         FACILITY:  [facility]  PHYSICIAN:  Lodema Pilot, MD       DATE OF BIRTH:  Sep 16, 1971  DATE OF PROCEDURE:  12/23/2012 DATE OF DISCHARGE:                              OPERATIVE REPORT   PROCEDURE:  Laparoscopic vertical sleeve gastrectomy with intraoperative upper endoscopy.  PREOPERATIVE DIAGNOSIS:  Morbid obesity.  POSTOPERATIVE DIAGNOSIS:  Morbid obesity.  SURGEON:  Lodema Pilot, MD  ASSISTANT:  Lorne Skeens. Hoxworth  ANESTHESIA:  General endotracheal anesthesia with 30 mL of 1% lidocaine with epinephrine and 0.25% Marcaine in a 50:50 mixture.  FLUIDS:  1600 mL of crystalloid.  ESTIMATED BLOOD LOSS:  Minimal.  DRAINS:  A 19-French Blake drain placed along the sleeve staple line.  SPECIMEN:  A greater curvature of the stomach sent to Pathology for permanent section.  SECTIONING COMPLICATIONS:  None apparent.  FINDINGS:  Sleeve gastrectomy using 36-French bougie.  Normal postoperative upper endoscopy.  No evidence of bleeding or stricture, or evidence of air leak.  INDICATION FOR PROCEDURE:  Mrs. [name] is a 42 year old female with a BMI of 42.5 with hypertension, reflux, sleep apnea, and arthritis with failed medical weight loss attempts.  She desires durable weight loss solutions.  OPERATIVE DETAILS:  Mrs. [name] was seen and evaluated in the preoperative area, and risks and benefits of the procedure were discussed in lay terms.  Informed consent was obtained.  I again would discuss with her the possibility of worsening reflux and need for conversion to gastric bypass, and she expressed understanding and desired to proceed with the sleeve gastrectomy.  She was given subcu heparin and prophylactic antibiotics, and taken to the operating room, placed on the operating table in a supine position.  General endotracheal tube anesthesia was  obtained and a Foley catheter was placed.  Her abdomen was prepped and draped in the standard surgical fashion and procedure time-out was performed with all operative team members confirming proper patient and procedure.  A 5-mm Optiview trocar was used to access the left upper quadrant on the first attempt and the pneumoperitoneum was obtained.  Laparoscope was introduced and there was no evidence of bleeding or bowel injury upon entry.  I placed a 5-mm left rectus port under direct visualization and a 15-mm right rectus port and a 5 mm right upper quadrant port and Nathanson liver retractor through the epigastric stab incision to retract the left lobe of the liver.  The stomach was suctioned and all other tubes were removed from the mouth and stomach, and I measured 5 cm from the pylorus and began dividing the short gastric vessels along the greater curvature.  Divided the short gastric vessels along the greater curvature of the stomach and this dissection was carried up around the greater curve towards the spleen.  The stomach was separated from the spleen and dissected the stomach off the left crus as well.  The left crus was identified.  She did have small better stomach posteriorly with posterior fat, but the hiatal defect did not appear very significant.  The posterior gastric attachments were divided  and were attached to the retroperitoneum, and after the stomach was completely mobilized, again creating my sleeve staple line.  At first, firing with a 60 mm black dry staple load, it was used to divide the stomach 5 cm from the pylorus angling up towards angularis, although care was taken to avoid narrowing the stomach, restriction of the stomach at the angularis incisura.  Second staple load of black Tri-Staple load was placed on the stomach and prior to firing the stapler, I passed the 36-French bougie through the mouth and along the lesser curvature stomach and into the antrum and  pylorus, and the second firing was taken and I transitioned to purple Tri-Staple loads and the remainder of the sleeve was created with 60 mm purple Tri- Staple loads.  Each fire was placed at the crotch of the prior staple load or towards the lesser curve side of the stomach to avoid Christmas tree formation of the staple line.  Lateral tension was applied on the stomach to minimize spiraling of the staple line and I completed the transection of the stomach angling off the gastric fat pad and taking care not to incorporate any esophagus with the final firing of the stapler.  The stomach was completely transected and clips were placed along the gastric staple line for hemostasis and a leak test was performed.  Dr. Johna Sheriff passed a well-lubricated fiberoptic endoscope through the mouth and into the sleeve while I had the sleeves emerged under water.  He insufflated and there was no evidence of air leakage along the staple line.  He navigated the scope through the tubular sleeve into the antrum and the pylorus, and there was no evidence of any stricturing or narrowing of the sleeve.  The water was suctioned from the abdomen and Tisseel fibrin glue was placed along the staple line. Then the 15-mm port site was enlarged in the greater curvature of the stomach, was removed and sent to Pathology for permanent section.  A 19- [place] Blake drain was placed in the abdomen and laid just posterior to the sleeve staple line and this was exited through the left upper quadrant trocar and sutured in place with 2-0 nylon drain stitch.  I then approximated the fascia at the extraction site of the 15-mm port site and secured the sutures and then reinsufflated the abdomen.  There was no evidence of bleeding or bowel injury.  The closure was noted be adequate.  The sleeve appeared to be hemostatic.  Again, there was no evidence of intra-abdominal bleeding or bowel injury.  The final trocars were removed and  the skin edges were approximated with a 4-0 Monocryl subcuticular suture.  Skin was washed and dried, and Dermabond was applied.  Foley catheter was removed and the patient tolerated the procedure well without apparent complications.  All sponge, needle, and instrument counts were correct.          ______________________________ Lodema Pilot, MD     BL/MEDQ  D:  12/23/2012  T:  12/23/2012  Job:  [id number]  cc:   Lorne Skeens. Hoxworth, M.D. [address] [place] [address]

## 2012-12-23 NOTE — Progress Notes (Signed)
She is doing fine.  "sore" but no nausea.

## 2012-12-23 NOTE — Anesthesia Postprocedure Evaluation (Signed)
  Anesthesia Post-op Note  Patient: [name]  Procedure(s) Performed: Procedure(s) (LRB): LAPAROSCOPIC GASTRIC SLEEVE RESECTION (N/A) ESOPHAGOGASTRODUODENOSCOPY (EGD) (N/A)  Patient Location: PACU  Anesthesia Type: General  Level of Consciousness: awake and alert   Airway and Oxygen Therapy: Patient Spontanous Breathing  Post-op Pain: mild  Post-op Assessment: Post-op Vital signs reviewed, Patient's Cardiovascular Status Stable, Respiratory Function Stable, Patent Airway and No signs of Nausea or vomiting  Last Vitals:  Filed Vitals:   12/23/12 1015  BP: 158/98  Pulse: 74  Temp: 36.4 C  Resp: 17    Post-op Vital Signs: stable   Complications: No apparent anesthesia complications

## 2012-12-23 NOTE — Transfer of Care (Signed)
Immediate Anesthesia Transfer of Care Note  Patient: [name]  Procedure(s) Performed: Procedure(s) (LRB) with comments: LAPAROSCOPIC GASTRIC SLEEVE RESECTION (N/A) ESOPHAGOGASTRODUODENOSCOPY (EGD) (N/A)  Patient Location: PACU  Anesthesia Type:General  Level of Consciousness: awake, sedated and patient cooperative  Airway & Oxygen Therapy: Patient Spontanous Breathing and Patient connected to face mask oxygen  Post-op Assessment: Report given to PACU RN, Post -op Vital signs reviewed and stable and Patient moving all extremities X 4  Post vital signs: Reviewed and stable  Complications: No apparent anesthesia complications

## 2012-12-24 ENCOUNTER — Encounter (HOSPITAL_COMMUNITY): Payer: Self-pay | Admitting: General Surgery

## 2012-12-24 LAB — COMPREHENSIVE METABOLIC PANEL
ALT: 16 U/L (ref 0–35)
Calcium: 8.3 mg/dL — ABNORMAL LOW (ref 8.4–10.5)
Creatinine, Ser: 0.6 mg/dL (ref 0.50–1.10)
GFR calc Af Amer: >90 mL/min (ref >90)
GFR calc non Af Amer: >90 mL/min (ref >90)
Glucose, Bld: 113 mg/dL — ABNORMAL HIGH (ref 70–99)
Total Protein: 7 g/dL (ref 6.0–8.3)

## 2012-12-24 LAB — CBC WITH DIFFERENTIAL/PLATELET
Basophils Relative: 0 % (ref 0–1)
Eosinophils Relative: 0 % (ref 0–5)
HCT: 40 % (ref 36.0–46.0)
Hemoglobin: 13.6 g/dL (ref 12.0–15.0)
Lymphocytes Relative: 13 % (ref 12–46)
MCHC: 34 g/dL (ref 30.0–36.0)
Monocytes Relative: 6 % (ref 3–12)
Neutro Abs: 8.4 10*3/uL — ABNORMAL HIGH (ref 1.7–7.7)
Neutrophils Relative %: 81 % — ABNORMAL HIGH (ref 43–77)
RBC: 4.61 MIL/uL (ref 3.87–5.11)
WBC: 10.3 10*3/uL (ref 4.0–10.5)

## 2012-12-24 MED ORDER — HYDROCHLOROTHIAZIDE 25 MG PO TABS
25.0000 mg | ORAL_TABLET | Freq: Every day | ORAL | Status: DC
  Administered 2012-12-24 – 2012-12-25 (×2): 25 mg via ORAL
  Filled 2012-12-24 (×2): qty 1

## 2012-12-24 MED ORDER — LIP MEDEX EX OINT
TOPICAL_OINTMENT | CUTANEOUS | Status: AC
  Administered 2012-12-24: 11:00:00
  Filled 2012-12-24: qty 7

## 2012-12-24 NOTE — Care Management Note (Signed)
    Page 1 of 1   12/24/2012     10:51:40 AM   CARE MANAGEMENT NOTE 12/24/2012  Patient:  [name], [name]   Account Number:  [id number]  Date Initiated:  12/24/2012  Documentation initiated by:  Lorenda Ishihara  Subjective/Objective Assessment:   42 yo female admitted s/p lap sleeve gastrectomy. PTA lived at home with family.     Action/Plan:   Home with spouse.   Anticipated DC Date:  12/26/2012   Anticipated DC Plan:  HOME/SELF CARE      DC Planning Services  CM consult      Choice offered to / List presented to:             Status of service:  Completed, signed off Medicare Important Message given?   (If response is "NO", the following Medicare IM given date fields will be blank) Date Medicare IM given:   Date Additional Medicare IM given:    Discharge Disposition:  HOME/SELF CARE  Per UR Regulation:  Reviewed for med. necessity/level of care/duration of stay  If discussed at Long Length of Stay Meetings, dates discussed:    Comments:

## 2012-12-24 NOTE — Progress Notes (Signed)
Patient is alert and oriented, up ambulating in the hallway. VSS, minimal abdominal pain.  Patient denies nasuea, vomiting, gas, or bowel movement.  Patient is using incentive spirometry and wearing compression hose while in bed.  Patient is aware of hospital support group and belt program.  Patient has follow up appointment with [facility] and [facility].  Discharged instructions listed below reviewed with patient.  Patient voiced financial concerns over purchasing nutritional supplements at discharge.  Bariatric dietician notified to see if she could be of assistance to the patient.  Will follow up with dietician.    Quenton Fetter, Rn  GASTRIC BYPASS/SLEEVE DISCHARGE INSTRUCTIONS  Drs. Fredrik Rigger, Hoxworth, Wilson, and [place] Call if you have any problems.   Call [phone] and ask for the surgeon on call.    If you need immediate assistance come to the ER at [facility]. Tell the ER personnel that you are a new post-op gastric bypass patient. Signs and symptoms to report:   Severe vomiting or nausea. If you cannot tolerate clear liquids for longer than 1 day, you need to call your surgeon.    Abdominal pain which does not get better after taking your pain medication   Fever greater than 101 F degree   Difficulty breathing   Chest pain    Redness, swelling, drainage, or foul odor at incision sites    If your incisions open or pull apart   Swelling or pain in calf (lower leg)   Diarrhea, frequent watery, uncontrolled bowel movements.   Constipation, (no bowel movements for 3 days) if this occurs, Take Milk of Magnesia, 2 tablespoons by mouth, 3 times a day for 2 days if needed.  Call your doctor if constipation continues. Stop taking Milk of Magnesia once you have had a bowel movement. You may also use Miralax according to the label instructions.   Anything you consider "abnormal for you".   Normal side effects after Surgery:   Unable to sleep at night or concentrate   Irritability   Being tearful  (crying) or depressed   These are common complaints, possibly related to your anesthesia, stress of surgery and change in lifestyle, that usually go away a few weeks after surgery.  If these feelings continue, call your medical doctor.  Wound Care You may have surgical glue, steri-strips, or staples over your incisions after surgery.  Surgical glue:  Looks like a clear film over your incisions and will wear off gradually. Steri-strips: Strips of tape over your incisions. You may notice a yellowish color on the skin underneath the steri-strips. This is a substance used to make the steri-strips stick better. Do not pull the steri-strips off - let them fall off.  Staples: Cherlynn Polo may be removed before you leave the hospital. If you go home with staples, call [facility] [phone]) for an appointment with your surgeon's nurse to have staples removed in 7 - 10 days. Showering: You may shower two days after your surgery unless otherwise instructed by your surgeon. Wash gently around wounds with warm soapy water, rinse well, and gently pat dry.  If you have a drain, you may need someone to hold this while you shower. Avoid tub baths until staples are removed and incisions are healed.    Medications   Medications should be liquid or crushed if larger than the size of a dime.  Extended release pills should not be crushed.   Depending on the size and number of medications you take, you may need to  stagger/change the time you take your medications so that you do not over-fill your pouch.    Make sure you follow-up with your primary care physician to make medication adjustments needed during rapid weight loss and life-style adjustment.   If you are diabetic, follow up with the doctor that prescribes your diabetes medication(s) within one week after surgery and check your blood sugar regularly.   Do not drive while taking narcotics!   Do not take acetaminophen (Tylenol) and Roxicet or Lortab  Elixir at the same time since these pain medications contain acetaminophen.  Diet at home: (First 2 Weeks) You will see the nutritionist two weeks after your surgery. She will advance your diet if you are tolerating liquids well. Once at home, if you have severe vomiting or nausea and cannot tolerate clear liquids lasting longer than 1 day, call your surgeon.  Begin high protein shake 2 ounces every 3 hours, 5 - 6 times per day.  Gradually increase the amount you drink as tolerated.  You may find it easier to slowly sip shakes throughout the day.  It is important to get your proteins in first.   Protein Shake   Drink at least 2 ounces of shake 5-6 times per day   Each serving of protein shakes should have a minimum of 15 grams of protein and no more than 5 grams of carbohydrate    Increase the amount of protein shake you drink as tolerated   Protein powder may be added to fluids such as non-fat milk or Lactaid milk (limit to 20 grams added protein powder per serving   The initial goal is to drink at least 8 ounces of protein shake/drink per day (or as directed by the nutritionist). Some examples of protein shakes are [employer], Dillard's, EAS Edge HP, and Unjury. Hydration   Gradually increase the amount of water and other liquids as tolerated (See Acceptable Fluids)   Gradually increase the amount of protein shake as tolerated     Sip fluids slowly and throughout the day   May use Sugar substitutes, use sparingly (limit to 6 - 8 packets per day). Your fluid goal is 64 ounces of fluid daily. It may take a few weeks to build up to this.         32 oz (or more) should be clear liquids and 32 oz (or more) should be full liquids.         Liquids should not contain sugar, caffeine, or carbonation! Acceptable Fluids Clear Liquids:   Water or Sugar-free flavored water, Fruit H2O   Decaffeinated coffee or tea (sugar-free)   Crystal Lite, Wyler's Lite, Minute Maid Lite   Sugar-free  Jell-O   Bouillon or broth   Sugar-free Popsicle:   *Less than 20 calories each; Limit 1 per day   Full Liquids:              Protein Shakes/Drinks + 2 choices per day of other full liquids shown below.    Other full liquids must be: No more than 12 grams of Carbs per serving,  No more than 3 grams of Fat per serving   Strained low-fat cream soup   Non-Fat milk   Fat-free Lactaid Milk   Sugar-free yogurt (Dannon Lite & Fit) Vitamins and Minerals (Start 1 day after surgery unless otherwise directed)   2 Chewable Multivitamin / Multimineral Supplement (i.e. Centrum for Adults)   Chewable Calcium Citrate with Vitamin D-3. Take 1500 mg each day.           (  Example: 3 Chewable Calcium Plus 600 with Vitamin D-3 can be found at [facility])         Vitamin B-12, 350 - 500 micrograms (oral tablet) each day   Do not mix multivitamins containing iron with calcium supplements; take 2 hours   apart   Do not substitute Tums (calcium carbonate) for your calcium   Menstruating women and those at risk for anemia may need extra iron. Talk with your doctor to see if you need additional iron.    If you need extra iron:  Total daily Iron recommendations (including Vitamins) = 50 - 100 mg Iron/day Do not stop taking or change any vitamins or minerals until you talk to your nutritionist or surgeon. Your nutritionist and / or physician must approve all vitamin and mineral supplements. Exercise For maximum success, begin exercising as soon as your doctor recommends. Make sure your physician approves any physical activity.   Depending on fitness level, begin with a simple walking program   Walk 5-15 minutes each day, 7 days per week.    Slowly increase until you are walking 30-45 minutes per day   Consider joining our BELT program. [phone] or email [email or web address] Things to remember:    You may have sexual relations when you feel comfortable. It is VERY important for female patients to use a reliable birth  control method. Fertility often increases after surgery. Do not get pregnant for at least 18 months.   It is very important to keep all follow up appointments with your surgeon, nutritionist, primary care physician, and behavioral health practitioner. After the first year, please follow up with your bariatric surgeon at least once a year in order to maintain best weight loss results.  [facility]: [phone] [facility]: [phone]   Free counseling is available for you and your family through collaboration between [facility] and [place]. Please call [phone] and leave a message.    Consider purchasing a medical alert bracelet that says you had gastric bypass surgery.    [facility] has a free Bariatric Surgery Support Group that meets monthly, the 3rd Thursday, 6 pm, Classroom #1, [place]. You may register online at [email or web address], but registration is not necessary. Select Classes and Support Groups, Bariatric Surgery, or Call [phone]   Do not return to work or drive until cleared by your surgeon   Use your CPAP when sleeping if applicable   Do not lift anything greater than ten pounds for at least two weeks

## 2012-12-24 NOTE — Progress Notes (Signed)
1 Day Post-Op  Subjective: No nausea.  Pain controlled.    Objective: Vital signs in last 24 hours: Temp:  [97.4 F (36.3 C)-98.1 F (36.7 C)] 97.9 F (36.6 C) (02/05 1000) Pulse Rate:  [65-92] 92  (02/05 1000) Resp:  [18-20] 18  (02/05 1000) BP: (132-181)/(84-113) 132/88 mmHg (02/05 1000) SpO2:  [94 %-98 %] 98 % (02/05 1000) Last BM Date: 12/23/12  Intake/Output from previous day: 02/04 0701 - 02/05 0700 In: 3935.4 [I.V.:3935.4] Out: 2740 [Urine:2700; Drains:40] Intake/Output this shift: Total I/O In: -  Out: 405 [Urine:400; Drains:5]  General appearance: alert, cooperative and no distress Resp: nonlabored Cardio: normal rate, regular GI: soft, appropriate incisional tenderness, ND, wounds okay, JP ss Extremities: SCD's bilat  Lab Results:   Basename 12/24/12 0420  WBC 10.3  HGB 13.6  HCT 40.0  PLT 227   BMET  Basename 12/24/12 0420  NA 138  K 4.5  CL 105  CO2 24  GLUCOSE 113*  BUN 6  CREATININE 0.60  CALCIUM 8.3*   PT/INR No results found for this basename: LABPROT:2,INR:2 in the last 72 hours ABG No results found for this basename: PHART:2,PCO2:2,PO2:2,HCO3:2 in the last 72 hours  Studies/Results: No results found.  Anti-infectives: Anti-infectives     Start     Dose/Rate Route Frequency Ordered Stop   12/23/12 0546   ertapenem (INVANZ) 1 g in sodium chloride 0.9 % 50 mL IVPB        1 g 100 mL/hr over 30 Minutes Intravenous On call to O.R. 12/23/12 0546 12/23/12 0726          Assessment/Plan: s/p Procedure(s) (LRB) with comments: LAPAROSCOPIC GASTRIC SLEEVE RESECTION (N/A) ESOPHAGOGASTRODUODENOSCOPY (EGD) (N/A) Trial clears.  continue to mobilize.  she seems to be doing well.  LOS: 1 day    Lodema Pilot DAVID 12/24/2012

## 2012-12-25 ENCOUNTER — Encounter: Payer: Self-pay | Admitting: *Deleted

## 2012-12-25 MED ORDER — OXYCODONE-ACETAMINOPHEN 5-325 MG/5ML PO SOLN: 5.0000 mL | ORAL | Status: DC | PRN

## 2012-12-25 MED ORDER — ONDANSETRON 4 MG PO TBDP: 4.0000 mg | ORAL_TABLET | Freq: Three times a day (TID) | ORAL | Status: DC | PRN

## 2012-12-25 NOTE — Discharge Summary (Signed)
  Physician Discharge Summary  Patient ID: [name] MRN: [id number] DOB/AGE: February 05, 1971 42 y.o.  Admit date: 12/23/2012 Discharge date: 12/25/2012  Admission Diagnoses: obesity  Discharge Diagnoses: obesity Active Problems:  * No active hospital problems. *    Discharged Condition: stable  Hospital Course: to OR for lap sleeve gastrectomy 12/23/12.  Did well postop and diet advanced on POD 1.  She was tolerating diet and pain controlled and stable for discharge to home on POD 2  Consults: None  Significant Diagnostic Studies: none  Treatments: surgery: 12/23/12 lap sleeve gastrectomy  Disposition: 01-Home or Self Care  Discharge Orders    Future Appointments: Provider: Department: Dept Phone: Center:   01/09/2013 8:30 AM Lodema Pilot, DO [facility], [place] [phone] None   01/09/2013 10:00 AM Orvil Feil Himmelrich, MS, RD [facility] [phone] NDM     Future Orders Please Complete By Expires   Increase activity slowly      Discharge instructions      Comments:   May shower tomorrow.   Full liquid diet x1 week, then pureed diet x1 week, then soft diet x1 week, then slowly advance to high protein, low fat, low carb diet as tolerated May increase activity as tolerated. Follow up with Dr. [name] in 3 weeks.   Call MD for:  temperature >100.4      Call MD for:  persistant nausea and vomiting      Call MD for:  severe uncontrolled pain      Call MD for:  redness, tenderness, or signs of infection (pain, swelling, redness, odor or green/yellow discharge around incision site)      Call MD for:  difficulty breathing, headache or visual disturbances          Medication List     As of 12/25/2012  7:21 AM    STOP taking these medications         aspirin EC 81 MG tablet      meloxicam 15 MG tablet   Commonly known as: MOBIC      TAKE these medications         gabapentin 300 MG capsule   Commonly known as: NEURONTIN    Take 300 mg by mouth 2 (two) times daily.      hydrochlorothiazide 25 MG tablet   Commonly known as: HYDRODIURIL   Take 25 mg by mouth daily.      losartan 50 MG tablet   Commonly known as: COZAAR   Take 50 mg by mouth daily before breakfast.      omeprazole 40 MG capsule   Commonly known as: PRILOSEC   Take 40 mg by mouth daily.      ondansetron 4 MG disintegrating tablet   Commonly known as: ZOFRAN-ODT   Take 1 tablet (4 mg total) by mouth every 8 (eight) hours as needed for nausea.      oxyCODONE-acetaminophen 5-325 MG/5ML solution   Commonly known as: ROXICET   Take 5 mLs by mouth every 4 (four) hours as needed.      valACYclovir 1000 MG tablet   Commonly known as: VALTREX   1,000 mg as needed.         SignedLodema Pilot DAVID 12/25/2012, 7:21 AM

## 2012-12-25 NOTE — Progress Notes (Signed)
2 Days Post-Op  Subjective: No issues overnight, tolerating diet and no nausea.  Objective: Vital signs in last 24 hours: Temp:  [97.9 F (36.6 C)-98.2 F (36.8 C)] 98.2 F (36.8 C) (02/05 2237) Pulse Rate:  [81-92] 88  (02/05 2237) Resp:  [18] 18  (02/05 2237) BP: (108-154)/(78-91) 108/84 mmHg (02/05 2237) SpO2:  [94 %-98 %] 98 % (02/05 2237) Last BM Date: 12/23/12  Intake/Output from previous day: 02/05 0701 - 02/06 0700 In: 3000 [I.V.:3000] Out: 4435 [Urine:4400; Drains:35] Intake/Output this shift:   General appearance: alert, cooperative and no distress  Resp: nonlabored  Cardio: normal rate, regular  GI: soft, mild incisional tenderness, ND, wounds without infection, JP ss    Lab Results:   Basename 12/24/12 0420  WBC 10.3  HGB 13.6  HCT 40.0  PLT 227   BMET  Basename 12/24/12 0420  NA 138  K 4.5  CL 105  CO2 24  GLUCOSE 113*  BUN 6  CREATININE 0.60  CALCIUM 8.3*   PT/INR No results found for this basename: LABPROT:2,INR:2 in the last 72 hours ABG No results found for this basename: PHART:2,PCO2:2,PO2:2,HCO3:2 in the last 72 hours  Studies/Results: No results found.  Anti-infectives: Anti-infectives     Start     Dose/Rate Route Frequency Ordered Stop   12/23/12 0546   ertapenem (INVANZ) 1 g in sodium chloride 0.9 % 50 mL IVPB        1 g 100 mL/hr over 30 Minutes Intravenous On call to O.R. 12/23/12 0546 12/23/12 0726          Assessment/Plan: s/p Procedure(s) (LRB) with comments: LAPAROSCOPIC GASTRIC SLEEVE RESECTION (N/A) ESOPHAGOGASTRODUODENOSCOPY (EGD) (N/A) she looks and feels well.  no evidence of any postop complications.  she should be okay for discharge to home today.  LOS: 2 days    Lodema Pilot DAVID 12/25/2012

## 2012-12-28 NOTE — Progress Notes (Signed)
Stopped by hospital and gave [name] the following samples. States she is having unforseen financial difficulties.   Premier Protein Shake: 10 ea Lot # [id number]; Exp: 09/26/13  Unjury Protein Powder: 8 pkts Lot # [id number]; Exp: 02/15 - 2 pkts Lot # [id number]; Exp: 03/15 - 5 pkts Lot # [id number]; Exp: 03/15 - 1 pkt  Bariatric Advantage Sublingual B12: 10 ea Lot # [id number]; Exp: 10/15  Opurity Band/Sleeve Optimized MVI: 10 ea Lot # [id number]; Exp: 06/14

## 2013-01-01 ENCOUNTER — Ambulatory Visit (INDEPENDENT_AMBULATORY_CARE_PROVIDER_SITE_OTHER): Payer: 59 | Admitting: General Surgery

## 2013-01-09 ENCOUNTER — Encounter: Payer: PRIVATE HEALTH INSURANCE | Attending: General Surgery | Admitting: *Deleted

## 2013-01-09 ENCOUNTER — Telehealth (INDEPENDENT_AMBULATORY_CARE_PROVIDER_SITE_OTHER): Payer: Self-pay

## 2013-01-09 ENCOUNTER — Ambulatory Visit (INDEPENDENT_AMBULATORY_CARE_PROVIDER_SITE_OTHER): Payer: PRIVATE HEALTH INSURANCE | Admitting: General Surgery

## 2013-01-09 ENCOUNTER — Encounter (INDEPENDENT_AMBULATORY_CARE_PROVIDER_SITE_OTHER): Payer: Self-pay | Admitting: General Surgery

## 2013-01-09 ENCOUNTER — Encounter: Payer: Self-pay | Admitting: *Deleted

## 2013-01-09 VITALS — BP 128/84 | HR 76 | Temp 97.1°F | Ht 73.0 in | Wt 299.0 lb

## 2013-01-09 VITALS — Ht 73.0 in | Wt 298.5 lb

## 2013-01-09 DIAGNOSIS — Z713 Dietary counseling and surveillance: Secondary | ICD-10-CM | POA: Insufficient documentation

## 2013-01-09 DIAGNOSIS — E669 Obesity, unspecified: Secondary | ICD-10-CM

## 2013-01-09 DIAGNOSIS — Z4889 Encounter for other specified surgical aftercare: Secondary | ICD-10-CM

## 2013-01-09 DIAGNOSIS — Z5189 Encounter for other specified aftercare: Secondary | ICD-10-CM

## 2013-01-09 DIAGNOSIS — Z01818 Encounter for other preprocedural examination: Secondary | ICD-10-CM | POA: Insufficient documentation

## 2013-01-09 NOTE — Addendum Note (Signed)
Addended by: Maryan Puls on: 01/09/2013 08:50 AM   Modules accepted: Medications

## 2013-01-09 NOTE — Progress Notes (Signed)
Subjective:     Patient ID: [name], female   DOB: May 09, 1971, 42 y.o.   MRN: [id number]  HPI This patient follows up 2 weeks status post laparoscopic vertical sleeve gastrectomy for obesity. She's doing very well and has lost about 23 pounds since her preoperative visit. She is walking about 40 minutes a day and denies any hunger. She is taking her proteins almonds and vitamins as well as staying hydrated. She has no abdominal pain or food intolerances. She denies any nausea or vomiting.  Review of Systems     Objective:   Physical Exam No distress nontoxic-appearing Her abdomen is soft and nontender exam her incisions have some scabs in place but are healing fine. There is no evidence of infection. No neurologic deficits    Assessment:     Status post vertical sleeve gastrectomy-doing well She is doing remarkably well from her procedure. She has no evidence of any postoperative complication. She has had good weight loss. Recommend that she continue with her vitamin supplementation and protein subluxation and continue with her physical activity.     Plan:     She will follow up with me in one month and we'll check some basic labs

## 2013-01-09 NOTE — Patient Instructions (Addendum)
Patient to follow Phase 3A-Soft, High Protein Diet and follow-up at [facility] in 6 weeks for 2 months post-op nutrition visit for diet advancement.  CONGRATS!!  Total of 35 lbs lost...20.5 lbs of FAT MASS lost since 12/17/12!!!  TANITA  BODY COMP RESULTS  06/04/12 12/17/12 01/09/13   BMI (kg/m^2) 43.2 41.8 39.4   Fat Mass (lbs) 174.0 178.5 158.0   Fat Free Mass (lbs) 153.5 138.0 140.5   Total Body Water (lbs) 112.5 101.0 103.0

## 2013-01-09 NOTE — Progress Notes (Addendum)
  Follow-up visit:  2 Weeks Post-Operative Gastric Sleeve Surgery  Medical Nutrition Therapy:  Appt start time: 0900 end time:  0930.  Primary concerns today: Post-operative Bariatric Surgery Nutrition Management. [name] returns today with 18 lb weight loss since last visit and c/o cramps in calves. Is not taking her HCTZ now, though reports decreased fluid intake. Discussed increasing fluids. May be dehydrated. Advanced to solids today.  Surgery date: 12/23/12 Surgery type: Gastric Sleeve Start weight at [facility]: 327.5 lbs (06/04/12)  Weight today: 298.5 lbs Weight change: 18.0 lbs Total weight lost: 29.0 lbs  Goal weight: 200 lbs % goal met: 23%  TANITA  BODY COMP RESULTS  06/04/12 12/17/12 01/09/13   BMI (kg/m^2) 43.2 41.8 39.4   Fat Mass (lbs) 174.0 178.5 158.0   Fat Free Mass (lbs) 153.5 138.0 140.5   Total Body Water (lbs) 112.5 101.0 103.0   Fluid intake:  24 oz water, 8 oz shake = 32 oz - discussed the need to increase Estimated total protein intake:  ~40-60 g - should increase as she changes over to solid foods  Medications: Taking Roxicet (at night) and Losartan only Supplementation: Taking MVI and B12; no calcium   Using straws: No Drinking while eating: No Hair loss: No Carbonated beverages: No N/V/D/C: Diarrhea a few times; inconsistent Dumping syndrome: No  Recent physical activity:  Walking 40 min daily  Progress Towards Goal(s):  In progress.  Handouts given during visit include:  Phase 3A: High Protein  Samples given during visit include:   Freedavite MVI: 8 bottles - 5 tabs/bottle Lot: [id number]; Exp: 01/17  Premier Protein Shake: 2 ea Lot # [id number]; Exp: 09/26/13   Nutritional Diagnosis:  [place]-3.3 Overweight/obesity related to past poor dietary habits and physical inactivity as evidenced by patient w/ recent Gastric Sleeve surgery following dietary guidelines for continued weight loss.    Intervention:  Nutrition education/diet  advancement.  Monitoring/Evaluation:  Dietary intake, exercise, lap band fills, and body weight. Follow up in 6 weeks for 2 month post-op visit.

## 2013-01-09 NOTE — Telephone Encounter (Signed)
Patient calling into office with questions regarding Prilosec.  Patient does not need Prilosec at this time per Dr. [name].  If patient has a return of symptoms, patient advised to call our office to discuss further.

## 2013-01-24 ENCOUNTER — Encounter: Payer: Self-pay | Admitting: *Deleted

## 2013-01-24 NOTE — Progress Notes (Signed)
Mailed the following samples to patient (home address) d/t her ongoing financial difficulties and inability to afford supplements at this time.   [id number] MVI: 4 bottles @ 5 tabs/bottle Lot: [id number]; Exp: 03/17   Bariatric Advantage Calcium Citrate   Lot: [id number]; Exp: 10/15 - 14 tabs Lot: [id number]; Exp: 10/15 - 14 tabs   Bariatric Advantage MVI Complete Lot: [id number]; Exp: 06/15 - 19 tabs Lot: [id number]; Exp: 10/15 - 14 tabs   Opurity Calcium Citrate Lot: [id number]; Exp: 11/14 - 14 tabs   Opurity Bypass/Sleeve Optimized MVI Lot: [id number]; Exp: 06/14 - 6 tabs   Unjury Protein Powder Lot: [id number]; Exp: 03/15 - 4 pkts Lot: [id number]; Exp: 04/15 - 4 pkts

## 2013-02-06 ENCOUNTER — Ambulatory Visit: Payer: Self-pay | Admitting: *Deleted

## 2013-02-13 ENCOUNTER — Encounter: Payer: PRIVATE HEALTH INSURANCE | Attending: General Surgery | Admitting: *Deleted

## 2013-02-13 ENCOUNTER — Encounter (INDEPENDENT_AMBULATORY_CARE_PROVIDER_SITE_OTHER): Payer: Self-pay | Admitting: General Surgery

## 2013-02-13 ENCOUNTER — Encounter: Payer: Self-pay | Admitting: *Deleted

## 2013-02-13 ENCOUNTER — Telehealth (INDEPENDENT_AMBULATORY_CARE_PROVIDER_SITE_OTHER): Payer: Self-pay

## 2013-02-13 ENCOUNTER — Ambulatory Visit (INDEPENDENT_AMBULATORY_CARE_PROVIDER_SITE_OTHER): Payer: Self-pay | Admitting: General Surgery

## 2013-02-13 ENCOUNTER — Other Ambulatory Visit (INDEPENDENT_AMBULATORY_CARE_PROVIDER_SITE_OTHER): Payer: Self-pay

## 2013-02-13 ENCOUNTER — Other Ambulatory Visit (INDEPENDENT_AMBULATORY_CARE_PROVIDER_SITE_OTHER): Payer: Self-pay | Admitting: General Surgery

## 2013-02-13 VITALS — BP 122/84 | HR 96 | Temp 96.8°F | Resp 18 | Ht 73.0 in | Wt 279.2 lb

## 2013-02-13 VITALS — Ht 73.0 in | Wt 278.5 lb

## 2013-02-13 DIAGNOSIS — Z4889 Encounter for other specified surgical aftercare: Secondary | ICD-10-CM

## 2013-02-13 DIAGNOSIS — K912 Postsurgical malabsorption, not elsewhere classified: Secondary | ICD-10-CM

## 2013-02-13 DIAGNOSIS — Z713 Dietary counseling and surveillance: Secondary | ICD-10-CM | POA: Insufficient documentation

## 2013-02-13 DIAGNOSIS — E669 Obesity, unspecified: Secondary | ICD-10-CM

## 2013-02-13 DIAGNOSIS — Z01818 Encounter for other preprocedural examination: Secondary | ICD-10-CM | POA: Insufficient documentation

## 2013-02-13 DIAGNOSIS — Z5189 Encounter for other specified aftercare: Secondary | ICD-10-CM

## 2013-02-13 LAB — CBC
Hemoglobin: 14.8 g/dL (ref 12.0–15.0)
MCH: 28.6 pg (ref 26.0–34.0)
MCV: 84.4 fL (ref 78.0–100.0)
RBC: 5.18 MIL/uL — ABNORMAL HIGH (ref 3.87–5.11)
WBC: 9 10*3/uL (ref 4.0–10.5)

## 2013-02-13 LAB — COMPREHENSIVE METABOLIC PANEL
ALT: 14 U/L (ref 0–35)
CO2: 25 mEq/L (ref 19–32)
Calcium: 9.7 mg/dL (ref 8.4–10.5)
Chloride: 99 mEq/L (ref 96–112)
Creat: 0.75 mg/dL (ref 0.50–1.10)
Glucose, Bld: 93 mg/dL (ref 70–99)
Total Bilirubin: 0.6 mg/dL (ref 0.3–1.2)
Total Protein: 7.7 g/dL (ref 6.0–8.3)

## 2013-02-13 LAB — IBC PANEL
TIBC: 274 ug/dL (ref 250–470)
UIBC: 242 ug/dL (ref 125–400)

## 2013-02-13 LAB — IRON: Iron: 32 ug/dL — ABNORMAL LOW (ref 42–145)

## 2013-02-13 LAB — VITAMIN B12: Vitamin B-12: 820 pg/mL (ref 211–911)

## 2013-02-13 NOTE — Patient Instructions (Addendum)
Goals:  Follow Phase 3B: High Protein + Non-Starchy Vegetables  Eat 3-6 small meals/snacks, every 3-5 hrs  Increase lean protein foods to meet 60-80g goal  Increase fluid intake to 64oz +  Avoid drinking 15 minutes before, during and 30 minutes after eating  Aim for >30 min of physical activity daily  TANITA  BODY COMP RESULTS  06/04/12 12/17/12 01/09/13 02/13/13   BMI (kg/m^2) 43.2 41.8 39.4 36.7   Fat Mass (lbs) 174.0 178.5 158.0 146.5   Fat Free Mass (lbs) 153.5 138.0 140.5 132.0   Total Body Water (lbs) 112.5 101.0 103.0 96.5

## 2013-02-13 NOTE — Progress Notes (Addendum)
  Follow-up visit:  7 Weeks Post-Operative Gastric Sleeve Surgery  Medical Nutrition Therapy:  Appt start time: 0900 end time:  0930.  Primary concerns today: Post-operative Bariatric Surgery Nutrition Management. Zyliah returns today with additional 20 lb wt loss. Has resumed HCTZ.  Discussed increasing fluids; may be dehydrated. Tolerating solids better now.   Surgery date: 12/23/12 Surgery type: Gastric Sleeve Start weight at [facility]: 327.5 lbs (06/04/12)  Weight today: 278.5 lbs Weight change: 20.0 lbs Total weight lost: 49.0 lbs  Goal weight: 200 lbs % goal met: 38%  TANITA  BODY COMP RESULTS  06/04/12 12/17/12 01/09/13 02/13/13   BMI (kg/m^2) 43.2 41.8 39.4 36.7   Fat Mass (lbs) 174.0 178.5 158.0 146.5   Fat Free Mass (lbs) 153.5 138.0 140.5 132.0   Total Body Water (lbs) 112.5 101.0 103.0 96.5   Fluid intake:  48 oz water, 16 oz shake = 64 oz Estimated total protein intake:  ~40-60 g  Medications: See medication list.  Supplementation: Taking MVI and B12; no calcium at this time because she is out. Plans to get more today.  Using straws: No Drinking while eating: No Hair loss: No Carbonated beverages: No N/V/D/C: Diarrhea a few times; inconsistent Dumping syndrome: No  Recent physical activity:  Continues walking 40 min daily; has added daily situps at home  Progress Towards Goal(s):  In progress.  Handouts given during visit include:  Phase 3A: High Protein + Non-Starchy Vegetables  Samples given during visit include:   Celebrate Iron + C  Lot:  ; Exp:  - 18 mg (4 ea) Lot: [id number]; Exp: 09/15 (2 ea) Lot: [id number]; Exp: 03/14 (2 ea)   Nutritional Diagnosis:  [place]-3.3 Overweight/obesity related to past poor dietary habits and physical inactivity as evidenced by patient w/ recent Gastric Sleeve surgery following dietary guidelines for continued weight loss.    Intervention:  Nutrition education/diet advancement.  Monitoring/Evaluation:  Dietary intake, exercise, and  body weight. Follow up in 4 weeks for 3 month post-op visit.

## 2013-02-13 NOTE — Progress Notes (Signed)
Subjective:     Patient ID: [name], female   DOB: 1971/04/10, 42 y.o.   MRN: [id number]  HPI This patient follows up almost 2 months status post laparoscopic vertical sleeve gastrectomy for obesity. She's doing very well from her procedure and has lost about 50 pounds. She says that she doesn't have much of an appetite but has no food intolerances. She will occasionally he too fast and feel full but has not had any vomiting. She denies any reflux. She says that she is taking her vitamins and protein supplements. Her only complaint is that she does have some that time cramping in the back or leg but no numbness or tingling or other neurologic symptoms. She says that she has some occasional constipation but continues to be ambulatory walking about one hour per day  Review of Systems     Objective:   Physical Exam No distress and nontoxic-appearing sitting completely on the bed Her incisions are healing well without sign of infection. There is no evidence of any neurologic deficits     Assessment:     Status post arthroscopic vertical sleeve gastrectomy for obesity-doing well She's doing very well from weight-loss template and has no food intolerances. A lot of her complaints of constipation and feeling full are normal for this stage in her recovery.  I am not certain what is causing her leg cramping and have recommended that we check some nutrition labs to ensure that she does not having vitamin deficiencies which may be contributing to the cramping.    Plan:     We will check some nutrition labs and I will see her back in about 2 months for repeat evaluation or sooner as needed. We'll also give her prescription for vitamin B12 and the multivitamins

## 2013-02-13 NOTE — Telephone Encounter (Signed)
RX for Nascobal faxed to [facility] @ [phone].  RX, copy of insurance card & demographics faxed.

## 2013-02-16 ENCOUNTER — Telehealth (INDEPENDENT_AMBULATORY_CARE_PROVIDER_SITE_OTHER): Payer: Self-pay | Admitting: General Surgery

## 2013-02-16 NOTE — Telephone Encounter (Signed)
Pt called for lab results and given to her.  Her Vit B1 is still pending, but discussed the others with her.  She wanted Dr. [name] to know she is still having some cramps in her legs.

## 2013-02-17 ENCOUNTER — Telehealth (INDEPENDENT_AMBULATORY_CARE_PROVIDER_SITE_OTHER): Payer: Self-pay | Admitting: *Deleted

## 2013-02-17 NOTE — Telephone Encounter (Signed)
Patient called to state that her insurance has denied the prescription for the B12 and multivitamins. Patient also asked about her B1 but it has not resulted at this time.

## 2013-02-17 NOTE — Telephone Encounter (Signed)
Patient called to state that her insurance has denied the prescription for the B12 and multivitamins. Patient also asked about her B1 but it has not resulted at this time.  

## 2013-02-18 NOTE — Telephone Encounter (Signed)
Spoke to patient regarding her iron results.  Patient advised to make sure she's taking a multi-vitamin that contains iron.  Patient given results for B1 (normal) patient advised to continue drinking liquids to stay hydrated and continue with walking.

## 2013-02-18 NOTE — Telephone Encounter (Signed)
Patient advised that she can purchase OTC B1 and B12 without a prescription.

## 2013-03-02 ENCOUNTER — Telehealth (INDEPENDENT_AMBULATORY_CARE_PROVIDER_SITE_OTHER): Payer: Self-pay | Admitting: General Surgery

## 2013-03-02 NOTE — Telephone Encounter (Signed)
I called her to recommend a multivitamin with iron.  She is already taking an iron supplement and her leg cramping has resolved.

## 2013-03-13 ENCOUNTER — Encounter: Payer: Self-pay | Admitting: *Deleted

## 2013-03-13 ENCOUNTER — Encounter: Payer: PRIVATE HEALTH INSURANCE | Attending: General Surgery | Admitting: *Deleted

## 2013-03-13 VITALS — Ht 73.0 in | Wt 270.5 lb

## 2013-03-13 DIAGNOSIS — E669 Obesity, unspecified: Secondary | ICD-10-CM

## 2013-03-13 DIAGNOSIS — Z01818 Encounter for other preprocedural examination: Secondary | ICD-10-CM | POA: Insufficient documentation

## 2013-03-13 DIAGNOSIS — Z713 Dietary counseling and surveillance: Secondary | ICD-10-CM | POA: Insufficient documentation

## 2013-03-13 NOTE — Progress Notes (Addendum)
  Follow-up visit:  11 Weeks Post-Operative Gastric Sleeve Surgery  Medical Nutrition Therapy:  Appt start time: 0900 end time:  0930.  Primary concerns today: Post-operative Bariatric Surgery Nutrition Management. Skipping meals and not eating many solid foods.  Relies mainly on protein shakes and takes supplements consistently. Discussed increasing solid food, especially animal protein to help her low iron levels.  States "I will try".  Reports yeast infection under folds of skin above hips. See scanned MD note under media tab.  Surgery date: 12/23/12 Surgery type: Gastric Sleeve Start weight at [facility]: 327.5 lbs (06/04/12)  Weight today: 270.5 lbs Weight change: 8.0 lbs Total weight lost: 57.0 lbs  Goal weight: 200 lbs % goal met: 45%  TANITA  BODY COMP RESULTS  06/04/12 12/17/12 01/09/13 02/13/13 03/13/13   BMI (kg/m^2) 43.2 41.8 39.4 36.7 35.7   Fat Mass (lbs) 174.0 178.5 158.0 146.5 135.5   Fat Free Mass (lbs) 153.5 138.0 140.5 132.0 135.0   Total Body Water (lbs) 112.5 101.0 103.0 96.5 99.0   24-Hour Dietary Recall B (6 AM): NONE ("no appetite") S (9 AM): 100 calorie pretzel pack L: 8 oz Unjury shake w/ 1% milk S:  NONE D: 4-5 pcs (small) breaded shrimp S: NONE  Fluid intake:  50 oz water w/ Mio, 8-16 oz shakes = 64 oz Estimated total protein intake:  ~40-60 g  Medications: See medication list.  Supplementation: Taking MVI, B12, Calcium; Has added iron supplement per MD d/t low iron levels  Using straws: No Drinking while eating: No; sips if food is dry Hair loss: No Carbonated beverages: No N/V/D/C: Constipation often; taking stool softeners. Advised to try Miralax or MOM for relief Dumping syndrome:  No  Recent physical activity:  Continues walking 60 min daily. PT [facility] knee at [facility] in [place]  Progress Towards Goal(s):  In progress.  Nutritional Diagnosis:  [name]-3.3 Overweight/obesity related to past poor dietary habits and physical inactivity as  evidenced by patient w/ recent Gastric Sleeve surgery following dietary guidelines for continued weight loss.    Intervention:  Nutrition education/diet advancement.  Monitoring/Evaluation:  Dietary intake, exercise, and body weight. Follow up in 2 months for 5 month post-op visit.

## 2013-03-13 NOTE — Patient Instructions (Addendum)
Goals:  Follow Phase 3B: High Protein + Non-Starchy Vegetables  AVOID meal skipping!!  Incorporate more solid food instead of protein shakes  Increase lean protein foods to meet 60-80g goal  Increase fluid intake to 64oz +  Avoid drinking 15 minutes before, during and 30 minutes after eating  Aim for >30 min of physical activity daily

## 2013-05-10 ENCOUNTER — Emergency Department (HOSPITAL_COMMUNITY)
Admission: EM | Admit: 2013-05-10 | Discharge: 2013-05-10 | Disposition: A | Discharge status: Home / Self Care | Payer: PRIVATE HEALTH INSURANCE | Attending: Emergency Medicine | Admitting: Emergency Medicine

## 2013-05-10 ENCOUNTER — Emergency Department (HOSPITAL_COMMUNITY): Payer: PRIVATE HEALTH INSURANCE

## 2013-05-10 ENCOUNTER — Encounter (HOSPITAL_COMMUNITY): Payer: Self-pay

## 2013-05-10 DIAGNOSIS — Z79899 Other long term (current) drug therapy: Secondary | ICD-10-CM | POA: Insufficient documentation

## 2013-05-10 DIAGNOSIS — R1013 Epigastric pain: Secondary | ICD-10-CM

## 2013-05-10 DIAGNOSIS — K219 Gastro-esophageal reflux disease without esophagitis: Secondary | ICD-10-CM | POA: Insufficient documentation

## 2013-05-10 DIAGNOSIS — Z8719 Personal history of other diseases of the digestive system: Secondary | ICD-10-CM | POA: Insufficient documentation

## 2013-05-10 DIAGNOSIS — I10 Essential (primary) hypertension: Secondary | ICD-10-CM | POA: Insufficient documentation

## 2013-05-10 DIAGNOSIS — Z8669 Personal history of other diseases of the nervous system and sense organs: Secondary | ICD-10-CM | POA: Insufficient documentation

## 2013-05-10 DIAGNOSIS — F17200 Nicotine dependence, unspecified, uncomplicated: Secondary | ICD-10-CM | POA: Insufficient documentation

## 2013-05-10 LAB — COMPREHENSIVE METABOLIC PANEL
ALT: 13 U/L (ref 0–35)
AST: 14 U/L (ref 0–37)
Albumin: 3.9 g/dL (ref 3.5–5.2)
Alkaline Phosphatase: 48 U/L (ref 39–117)
BUN: 8 mg/dL (ref 6–23)
CO2: 24 mEq/L (ref 19–32)
Calcium: 9.4 mg/dL (ref 8.4–10.5)
Chloride: 102 mEq/L (ref 96–112)
Creatinine, Ser: 0.58 mg/dL (ref 0.50–1.10)
GFR calc Af Amer: >90 mL/min (ref >90)
GFR calc non Af Amer: >90 mL/min (ref >90)
Glucose, Bld: 78 mg/dL (ref 70–99)
Potassium: 3.7 mEq/L (ref 3.5–5.1)
Sodium: 138 mEq/L (ref 135–145)
Total Bilirubin: 0.5 mg/dL (ref 0.3–1.2)
Total Protein: 7.4 g/dL (ref 6.0–8.3)

## 2013-05-10 LAB — URINALYSIS, ROUTINE W REFLEX MICROSCOPIC
Bilirubin Urine: NEGATIVE
Glucose, UA: NEGATIVE mg/dL
Hgb urine dipstick: NEGATIVE
Ketones, ur: 15 mg/dL — AB
Nitrite: POSITIVE — AB
Protein, ur: NEGATIVE mg/dL
Specific Gravity, Urine: 1.017 (ref 1.005–1.030)
Urobilinogen, UA: 0.2 mg/dL (ref 0.0–1.0)
pH: 5.5 (ref 5.0–8.0)

## 2013-05-10 LAB — CBC WITH DIFFERENTIAL/PLATELET
Basophils Absolute: 0 10*3/uL (ref 0.0–0.1)
Basophils Relative: 0 % (ref 0–1)
Eosinophils Absolute: 0.1 10*3/uL (ref 0.0–0.7)
Eosinophils Relative: 1 % (ref 0–5)
HCT: 40.8 % (ref 36.0–46.0)
Hemoglobin: 13.5 g/dL (ref 12.0–15.0)
Lymphocytes Relative: 31 % (ref 12–46)
Lymphs Abs: 2.3 10*3/uL (ref 0.7–4.0)
MCH: 28.2 pg (ref 26.0–34.0)
MCHC: 33.1 g/dL (ref 30.0–36.0)
MCV: 85.4 fL (ref 78.0–100.0)
Monocytes Absolute: 0.5 10*3/uL (ref 0.1–1.0)
Monocytes Relative: 6 % (ref 3–12)
Neutro Abs: 4.5 10*3/uL (ref 1.7–7.7)
Neutrophils Relative %: 61 % (ref 43–77)
Platelets: 187 10*3/uL (ref 150–400)
RBC: 4.78 MIL/uL (ref 3.87–5.11)
RDW: 13.8 % (ref 11.5–15.5)
WBC: 7.4 10*3/uL (ref 4.0–10.5)

## 2013-05-10 LAB — URINE MICROSCOPIC-ADD ON

## 2013-05-10 LAB — LIPASE, BLOOD: Lipase: 15 U/L (ref 11–59)

## 2013-05-10 MED ORDER — HYDROCODONE-ACETAMINOPHEN 5-325 MG PO TABS: 1.0000 | ORAL_TABLET | Freq: Four times a day (QID) | ORAL | Status: DC | PRN

## 2013-05-10 MED ORDER — ONDANSETRON HCL 4 MG/2ML IJ SOLN
4.0000 mg | Freq: Once | INTRAMUSCULAR | Status: AC
  Administered 2013-05-10: 4 mg via INTRAVENOUS
  Filled 2013-05-10: qty 2

## 2013-05-10 MED ORDER — FENTANYL CITRATE 0.05 MG/ML IJ SOLN
100.0000 ug | Freq: Once | INTRAMUSCULAR | Status: AC
  Administered 2013-05-10: 100 ug via INTRAVENOUS
  Filled 2013-05-10: qty 2

## 2013-05-10 MED ORDER — SODIUM CHLORIDE 0.9 % IV BOLUS (SEPSIS)
1000.0000 mL | Freq: Once | INTRAVENOUS | Status: AC
  Administered 2013-05-10: 1000 mL via INTRAVENOUS

## 2013-05-10 MED ORDER — SUCRALFATE 1 G PO TABS: 1.0000 g | ORAL_TABLET | Freq: Four times a day (QID) | ORAL | Status: DC

## 2013-05-10 MED ORDER — IOHEXOL 300 MG/ML  SOLN
50.0000 mL | Freq: Once | INTRAMUSCULAR | Status: AC | PRN
  Administered 2013-05-10: 50 mL via ORAL

## 2013-05-10 MED ORDER — IOHEXOL 300 MG/ML  SOLN
100.0000 mL | Freq: Once | INTRAMUSCULAR | Status: AC | PRN
  Administered 2013-05-10: 100 mL via INTRAVENOUS

## 2013-05-10 MED ORDER — ESOMEPRAZOLE MAGNESIUM 20 MG PO CPDR: 20.0000 mg | DELAYED_RELEASE_CAPSULE | Freq: Every day | ORAL | Status: DC

## 2013-05-10 NOTE — ED Notes (Signed)
Patient c/o left upper quadrant pain that radiates to the left mid back. Patient states she had a gastric sleeve done in February for weight loss. Patient denies any N/V/D or abdominal bloating.

## 2013-05-11 ENCOUNTER — Telehealth (HOSPITAL_COMMUNITY): Payer: Self-pay | Admitting: *Deleted

## 2013-05-11 ENCOUNTER — Other Ambulatory Visit (INDEPENDENT_AMBULATORY_CARE_PROVIDER_SITE_OTHER): Payer: Self-pay | Admitting: General Surgery

## 2013-05-11 ENCOUNTER — Telehealth (INDEPENDENT_AMBULATORY_CARE_PROVIDER_SITE_OTHER): Payer: Self-pay

## 2013-05-11 ENCOUNTER — Telehealth (INDEPENDENT_AMBULATORY_CARE_PROVIDER_SITE_OTHER): Payer: Self-pay | Admitting: General Surgery

## 2013-05-11 MED ORDER — ESOMEPRAZOLE MAGNESIUM 20 MG PO CPDR: 20.0000 mg | DELAYED_RELEASE_CAPSULE | Freq: Every day | ORAL | Status: DC

## 2013-05-11 MED ORDER — ESOMEPRAZOLE MAGNESIUM 20 MG PO CPDR: 40.0000 mg | DELAYED_RELEASE_CAPSULE | Freq: Every day | ORAL | Status: DC

## 2013-05-11 MED ORDER — SULFAMETHOXAZOLE-TRIMETHOPRIM 800-160 MG PO TABS: 1.0000 | ORAL_TABLET | Freq: Two times a day (BID) | ORAL | Status: AC

## 2013-05-11 NOTE — ED Provider Notes (Signed)
History     CSN: [id number]  Arrival date & time 05/10/13  1641   First MD Initiated Contact with Patient 05/10/13 1702      Chief Complaint  Patient presents with  . Abdominal Pain    (Consider location/radiation/quality/duration/timing/severity/associated sxs/prior treatment) HPI Patient presents emergency department with left upper abdominal pain, that began 1 week ago.  Patient, states she's had a history of gastric sleeve and the pain is in the same general area.  Patient denies nausea, vomiting, diarrhea, chest pain, shortness breath, headache, blurred vision, weakness, numbness, fever, dysuria, vaginal bleeding, vaginal discharge, or syncope.  Patient, states, that she does have some pain in her back as well.  Patient, states nothing seems to make her condition, better or worse.  She states that palpation does increase the intensity of her pain.  Symptoms have been constant over the last week. Past Medical History  Diagnosis Date  . GERD (gastroesophageal reflux disease)   . Hypertension   . Morbid obesity   . Sleep apnea     no cpap used  . Hiatal hernia     Per pt on 06/04/12    Past Surgical History  Procedure Laterality Date  . Knee arthroscopy      left  . Inner ear surgery    . Breath tek h pylori  06/25/2012    Procedure: BREATH TEK H PYLORI;  Surgeon: Mariella Saa, MD;  Location: [facility];  Service: General;  Laterality: N/A;  Dr. Delice Lesch patient  . Abdominal hysterectomy  12 yrs ago  . Laparoscopic gastric sleeve resection  12/23/2012    Procedure: LAPAROSCOPIC GASTRIC SLEEVE RESECTION;  Surgeon: Lodema Pilot, DO;  Location: [facility];  Service: General;  Laterality: N/A;  . Esophagogastroduodenoscopy  12/23/2012    Procedure: ESOPHAGOGASTRODUODENOSCOPY (EGD);  Surgeon: Lodema Pilot, DO;  Location: [facility];  Service: General;  Laterality: N/A;  . Hernia repair      Family History  Problem Relation Age of Onset  . Cancer Father     lymphoma  . Cancer  Paternal Grandmother     breast    History  Substance Use Topics  . Smoking status: Current Some Day Smoker -- 5 years  . Smokeless tobacco: Never Used  . Alcohol Use: No    OB History   Grav Para Term Preterm Abortions TAB SAB Ect Mult Living                  Review of Systems All other systems negative except as documented in the HPI. All pertinent positives and negatives as reviewed in the HPI. Allergies  Oxycodone  Home Medications   Current Outpatient Rx  Name  Route  Sig  Dispense  Refill  . citalopram (CELEXA) 10 MG tablet   Oral   Take 10 mg by mouth daily.         . hydrochlorothiazide (HYDRODIURIL) 25 MG tablet   Oral   Take 25 mg by mouth daily.         Marland Kitchen losartan (COZAAR) 50 MG tablet   Oral   Take 50 mg by mouth daily before breakfast.         . Multiple Vitamin (MULTIVITAMIN WITH MINERALS) TABS   Oral   Take 1 tablet by mouth daily.         Marland Kitchen esomeprazole (NEXIUM) 20 MG capsule   Oral   Take 1 capsule (20 mg total) by mouth daily before breakfast.   14 capsule  0   . HYDROcodone-acetaminophen (NORCO/VICODIN) 5-325 MG per tablet   Oral   Take 1 tablet by mouth every 6 (six) hours as needed for pain.   15 tablet   0   . sucralfate (CARAFATE) 1 G tablet   Oral   Take 1 tablet (1 g total) by mouth 4 (four) times daily.   28 tablet   0     BP 113/68  Pulse 60  Temp(Src) 97.7 F (36.5 C) (Oral)  Resp 18  Ht 6\' 1"  (1.854 m)  Wt 255 lb 7 oz (115.866 kg)  BMI 33.71 kg/m2  SpO2 100%  Physical Exam  Nursing note and vitals reviewed. Constitutional: She is oriented to person, place, and time. She appears well-developed and well-nourished. No distress.  HENT:  Head: Normocephalic and atraumatic.  Mouth/Throat: Oropharynx is clear and moist.  Eyes: Pupils are equal, round, and reactive to light.  Neck: Normal range of motion. Neck supple.  Cardiovascular: Normal rate, regular rhythm and normal heart sounds.  Exam reveals no  gallop and no friction rub.   No murmur heard. Pulmonary/Chest: Effort normal and breath sounds normal. No respiratory distress.  Abdominal: Soft. Bowel sounds are normal. She exhibits no distension. There is tenderness in the epigastric area and left upper quadrant. There is no rigidity, no rebound and no guarding.    Neurological: She is alert and oriented to person, place, and time. She exhibits normal muscle tone. Coordination normal.  Skin: Skin is warm and dry.    ED Course  Procedures (including critical care time)  Labs Reviewed  URINALYSIS, ROUTINE W REFLEX MICROSCOPIC - Abnormal; Notable for the following:    APPearance CLOUDY (*)    Ketones, ur 15 (*)    Nitrite POSITIVE (*)    Leukocytes, UA MODERATE (*)    All other components within normal limits  URINE MICROSCOPIC-ADD ON - Abnormal; Notable for the following:    Bacteria, UA MANY (*)    All other components within normal limits  URINE CULTURE  COMPREHENSIVE METABOLIC PANEL  CBC WITH DIFFERENTIAL  LIPASE, BLOOD   Ct Abdomen Pelvis W Contrast  05/10/2013   *RADIOLOGY REPORT*  Clinical Data: Left upper quadrant pain.  Prior gastric sleeve surgery.  CT ABDOMEN AND PELVIS WITH CONTRAST  Technique:  Multidetector CT imaging of the abdomen and pelvis was performed following the standard protocol during bolus administration of intravenous contrast.  Contrast: 50mL OMNIPAQUE IOHEXOL 300 MG/ML  SOLN, OMNIPAQUE IOHEXOL 300 MG/ML  SOLN  Comparison: 03/29/2012  Findings: Lung bases are clear.  No effusions.  Heart is normal size.  Changes of prior gastric sleeve noted along the greater curvature of the stomach.  No complicating feature.  Liver, spleen, pancreas, adrenals and kidneys are normal.  No hydronephrosis.  No renal or ureteral stones.  Urinary bladder is decompressed.  Prior hysterectomy.  Small right ovarian cyst or follicle.  Adnexa unremarkable.  Appendix is visualized and is normal.  Large and small bowel grossly  unremarkable.  No free fluid, free air or adenopathy.  The aorta is normal caliber.  No acute bony abnormality.  Stable old mild compression fracture at T12 with associated degenerative changes at T11-12.  Mild kyphosis at this level.  IMPRESSION: No acute findings in the abdomen or pelvis.  Evidence of prior gastric sleeve procedure.   Original Report Authenticated By: Charlett Nose, M.D.     1. Epigastric pain    I spoke with Dr. gross of general surgery, who  advised that the patient followup with Dr. [name] lab testing, and CT scan were unremarkable.  Dr. gross also suggested heartburn medicine, also advised to go back to more liquid diet for the next few days.  Patient is a rest return here as needed.  Told to call Dr. [name] for an appointment  2:13 AM I will have the flow manager called the patient and a prescription for Cipro 500 for 7 days.   MDM  MDM Reviewed: vitals, nursing note and previous chart Interpretation: labs and CT scan           Carlyle Dolly, PA-C 05/11/13 0215

## 2013-05-11 NOTE — Telephone Encounter (Signed)
The patient called and reports she went to the ER with complications from a gastric sleeve.  She was having abdominal pain.  They did labs and a CT.  Dr Michaell Cowing reviewed them  and they were unremarkable.  She was ordered Nexium and Carafate.  She was told to call and get an appointment with Dr [name] but she has one Friday 6/27.  Please review her notes, tests and let her know if you need to see her sooner.

## 2013-05-11 NOTE — Telephone Encounter (Signed)
I called her because I heard that she was in the ER with LUQ pain.  Workup negative.  She is feeling a little better.  UA positive for UTI.  I prescribed some bactrim and nexium and she will follow up with me this week.

## 2013-05-12 LAB — URINE CULTURE: Colony Count: >=100000

## 2013-05-12 NOTE — ED Provider Notes (Signed)
Medical screening examination/treatment/procedure(s) were performed by non-physician practitioner and as supervising physician I was immediately available for consultation/collaboration.  Landy Mace K Linker, MD 05/12/13 1713 

## 2013-05-13 NOTE — ED Notes (Signed)
Post ED Visit - Positive Culture Follow-up  Culture report reviewed by antimicrobial stewardship pharmacist: []  Wes Dulaney, Pharm.D., BCPS [x]  Celedonio Miyamoto, Pharm.D., BCPS []  Georgina Pillion, Pharm.D., BCPS []  [place], [place].D., BCPS, AAHIVP []  Estella Husk, Pharm.D., BCPS, AAHIVP  Positive urine culture Treated by Lodema Pilot DO prescribed bactrim- no further patient follow-up is required at this time per [place] Nurse.  Larena Sox 05/13/2013, 1:24 PM

## 2013-05-15 ENCOUNTER — Encounter: Payer: Self-pay | Admitting: *Deleted

## 2013-05-15 ENCOUNTER — Encounter: Payer: PRIVATE HEALTH INSURANCE | Attending: General Surgery | Admitting: *Deleted

## 2013-05-15 ENCOUNTER — Ambulatory Visit (INDEPENDENT_AMBULATORY_CARE_PROVIDER_SITE_OTHER): Payer: PRIVATE HEALTH INSURANCE | Admitting: General Surgery

## 2013-05-15 VITALS — Ht 73.0 in | Wt 248.5 lb

## 2013-05-15 VITALS — BP 118/78 | HR 78 | Resp 16 | Ht 73.0 in | Wt 249.0 lb

## 2013-05-15 DIAGNOSIS — Z713 Dietary counseling and surveillance: Secondary | ICD-10-CM | POA: Insufficient documentation

## 2013-05-15 DIAGNOSIS — Z01818 Encounter for other preprocedural examination: Secondary | ICD-10-CM | POA: Insufficient documentation

## 2013-05-15 DIAGNOSIS — R1012 Left upper quadrant pain: Secondary | ICD-10-CM

## 2013-05-15 DIAGNOSIS — E669 Obesity, unspecified: Secondary | ICD-10-CM

## 2013-05-15 MED ORDER — ESOMEPRAZOLE MAGNESIUM 20 MG PO CPDR: 20.0000 mg | DELAYED_RELEASE_CAPSULE | Freq: Every day | ORAL | Status: DC

## 2013-05-15 NOTE — Progress Notes (Addendum)
  Follow-up visit:  4.5 Months Post-Operative Gastric Sleeve Surgery  Medical Nutrition Therapy:  Appt start time: 0900 end time:  0930.  Primary concerns today: Post-operative Bariatric Surgery Nutrition Management. Still skipping meals and not eating many solid foods.  Relies mainly on protein shakes and takes supplements inconsistently. Discussed increasing solid foods. Reports she thinks she has an ulcer and is now on Carafate and 2 abx (by Dr. [name])  Surgery date: 12/23/12 Surgery type: Gastric Sleeve Start weight at [facility]: 327.5 lbs (06/04/12)  Weight today: 248.5 lbs Weight change: 22.0 lbs Total weight lost: 79.0 lbs  Goal weight: 200 lbs  TANITA  BODY COMP RESULTS  06/04/12 12/17/12 01/09/13 02/13/13 03/13/13 05/15/13   BMI (kg/m^2) 43.2 41.8 39.4 36.7 35.7 32.8   Fat Mass (lbs) 174.0 178.5 158.0 146.5 135.5 116.0   Fat Free Mass (lbs) 153.5 138.0 140.5 132.0 135.0 132.5   Total Body Water (lbs) 112.5 101.0 103.0 96.5 99.0 97.0   24-Hour Dietary Recall B (6 AM): 1 pc ham and 1 pc cheese - 10-12g S (9 AM): NONE L:  NONE or 4 oz shake - 15g S:  NONE or maybe a few chips D: 1/4 cup beans or chicken/steak - 8-15g S: NONE  Fluid intake:  50 oz water w/ Mio, diet green tea (17 oz); 4 oz shakes = 64 oz Estimated total protein intake:  ~40-50 g   Medications: See medication list. Carafate and 2 abx added for possible ulcer. No longer taking Cozaar (per Dr. [name]).  Supplementation: Taking all (including iron) inconsistently  Using straws: No Drinking while eating: No; sips if food is dry Hair loss: No Carbonated beverages: No N/V/D/C: Constipation often. Tries Miralax or MOM for relief Dumping syndrome:  No  Recent physical activity:  None recently d/t mother being in hospital. States intent to resume asap.   Samples given during visit include:   Premier Protein Shake: 10 ea Lot: [id number]; Exp: 12/01/13  Nascobal Nasal B12 (500 mcg): 1 ea Lot: [id number]; Exp:  07/15  Progress Towards Goal(s):  In progress.  Nutritional Diagnosis:  [place]-3.3 Overweight/obesity related to past poor dietary habits and physical inactivity as evidenced by patient w/ recent Gastric Sleeve surgery following dietary guidelines for continued weight loss.    Intervention:  Nutrition education.  Monitoring/Evaluation:  Dietary intake, exercise, and body weight. Follow up in 2 months.

## 2013-05-15 NOTE — Progress Notes (Signed)
Subjective:     Patient ID: [name], female   DOB: March 15, 1971, 42 y.o.   MRN: [id number]  HPI She follows up today 4 months s/p sleeve gastrectomy.  She has lost 73 lbs and has been doing well until she was seen in the ER for LUQ abdominal pain.  She had negative CT scan and laboratory studies and was discharged home the night. She did have a UTI consult I started her on Bactrim earlier in the week and she says that her abdominal pain has improved but she still has some mild left-sided back pain. She had been eating well and denies any reflux but she has been taking Carafate as prescribed by the emergency room. She denies any 100 and says that she is taking her vitamins and protein supplements. She is moving her bowels but does have some constipation. She has not been exercising  Review of Systems     Objective:   Physical Exam No distress sitting completely the bed Her abdomen is soft and nontender on exam incisions are healing nicely without any sign of infection or hernia    Assessment:     Status post vertical sleeve gastrectomy-doing well she seems to be doing very appropriately. Not sure what was causing her to left upper quadrant and left back pain other than a possible UTI. She says that she does feel better but I did recommend that she start taking antibiotics for one month and see if this improves her symptoms in case this is a possible ulcer. I gave her a prescription for Nexium 20 mg daily today. She's going to finish antibiotic and she will followup with me in 2 months or sooner if the symptoms do not resolve. I recommended that she increase her physical activity and follow up with her primary care physician to see if she can come off her blood pressure medications     Plan:     She is improving and we will continue Nexium 20 mg daily and Carafate and increased physical activity and I will see her back in 2 months for her normal 6 months postoperative visit and will check  laboratory studies at that time

## 2013-05-15 NOTE — Patient Instructions (Addendum)
Goals:  Follow Phase 3B: High Protein + Non-Starchy Vegetables  AVOID meal skipping!!  Incorporate more solid food instead of protein shakes  Increase lean protein foods to meet 60-80g goal  Increase fluid intake to 64oz +  Avoid drinking 15 minutes before, during and 30 minutes after eating  Aim for >30 min of physical activity daily   TANITA  BODY COMP RESULTS  06/04/12 12/17/12 01/09/13 02/13/13 03/13/13 05/15/13   BMI (kg/m^2) 43.2 41.8 39.4 36.7 35.7 32.8   Fat Mass (lbs) 174.0 178.5 158.0 146.5 135.5 116.0   Fat Free Mass (lbs) 153.5 138.0 140.5 132.0 135.0 132.5   Total Body Water (lbs) 112.5 101.0 103.0 96.5 99.0 97.0

## 2013-06-12 ENCOUNTER — Ambulatory Visit: Payer: PRIVATE HEALTH INSURANCE | Admitting: *Deleted

## 2013-07-15 ENCOUNTER — Ambulatory Visit (INDEPENDENT_AMBULATORY_CARE_PROVIDER_SITE_OTHER): Payer: PRIVATE HEALTH INSURANCE | Admitting: General Surgery

## 2013-07-15 ENCOUNTER — Encounter (INDEPENDENT_AMBULATORY_CARE_PROVIDER_SITE_OTHER): Payer: Self-pay | Admitting: General Surgery

## 2013-07-15 ENCOUNTER — Encounter: Payer: PRIVATE HEALTH INSURANCE | Attending: General Surgery | Admitting: Dietician

## 2013-07-15 VITALS — Ht 73.0 in | Wt 242.5 lb

## 2013-07-15 VITALS — BP 118/72 | HR 76 | Resp 14 | Ht 73.0 in | Wt 242.2 lb

## 2013-07-15 DIAGNOSIS — Z713 Dietary counseling and surveillance: Secondary | ICD-10-CM | POA: Insufficient documentation

## 2013-07-15 DIAGNOSIS — E669 Obesity, unspecified: Secondary | ICD-10-CM

## 2013-07-15 DIAGNOSIS — K912 Postsurgical malabsorption, not elsewhere classified: Secondary | ICD-10-CM

## 2013-07-15 DIAGNOSIS — Z01818 Encounter for other preprocedural examination: Secondary | ICD-10-CM | POA: Insufficient documentation

## 2013-07-15 LAB — COMPREHENSIVE METABOLIC PANEL
AST: 12 U/L (ref 0–37)
BUN: 9 mg/dL (ref 6–23)
Calcium: 9.7 mg/dL (ref 8.4–10.5)
Chloride: 105 mEq/L (ref 96–112)
Creat: 0.81 mg/dL (ref 0.50–1.10)

## 2013-07-15 LAB — CBC WITH DIFFERENTIAL/PLATELET
Basophils Absolute: 0 10*3/uL (ref 0.0–0.1)
Eosinophils Relative: 1 % (ref 0–5)
HCT: 40.3 % (ref 36.0–46.0)
Lymphocytes Relative: 32 % (ref 12–46)
MCHC: 33.7 g/dL (ref 30.0–36.0)
MCV: 88.2 fL (ref 78.0–100.0)
Monocytes Absolute: 0.3 10*3/uL (ref 0.1–1.0)
RDW: 14.1 % (ref 11.5–15.5)
WBC: 6 10*3/uL (ref 4.0–10.5)

## 2013-07-15 LAB — IRON AND TIBC
%SAT: 24 % (ref 20–55)
Iron: 62 ug/dL (ref 42–145)
TIBC: 255 ug/dL (ref 250–470)
UIBC: 193 ug/dL (ref 125–400)

## 2013-07-15 NOTE — Progress Notes (Signed)
Subjective:     Patient ID: [name], female   DOB: 03-08-1971, 42 y.o.   MRN: [id number]  HPI This patient follows up 6 months status post vertical sleeve gastrectomy. She has no complaints today. She really hasn't lost that much weight since her last visit because she says that she has stopped exercising. Her mother has had surgery and has been living with her and this was required a lot of time to care for her which has limited her ability to exercise. She says that she is eating okay although she still doesn't have much appetite. She does say that she is snacking. She is drinking any fluids and taking her protein which is not taking her vitamins routinely. She does have some constipation and is taking stool softeners but no longer has the left upper quadrant pain that she had back in June. She has no complaints otherwise  Review of Systems     Objective:   Physical Exam No acute distress and nontoxic-appearing sitting comfortably on the bed Her abdomen is soft and nontender to exam her incisions were healing fine. She has no evidence of any neurologic dysfunction    Assessment:     Obesity status post vertical sleeve gastrectomy She seems to be doing very well from her procedure without any evidence of any postoperative complication. She is no longer having the right upper quadrant pain. She does have some constipation and has not had as much weight loss as I would like to see recently but I think that this is due to some snacking and lack of exercise. We discussed the need to continue with healthy diet and to increase her physical activity is as tolerated in order to achieve good weight loss. There is no evidence of any nutritional deficiencies but we will check some nutritional labs today and her vitamin labs Alexy her back in about 6 months.     Plan:     Resume healthy diet and increase physical activity We'll check some nutrition labs will see her back in 6 months or sooner as  needed

## 2013-07-15 NOTE — Progress Notes (Signed)
  Follow-up visit:  6 Months Post-Operative Gastric Sleeve Surgery  Medical Nutrition Therapy:  Appt start time: 0900 end time:  0930.  Primary concerns today: Post-operative Bariatric Surgery Nutrition Management.  Eating two meals per day and a snacks.   Still skipping meals and not eating many solid foods.  Relies mainly on protein shakes and takes supplements inconsistently. Discussed increasing solid foods. Reports she thinks she has an ulcer and is now on Carafate and 2 abx (by Dr. [name])  Surgery date: 12/23/12 Surgery type: Gastric Sleeve Start weight at [facility]: 327.5 lbs (06/04/12) Weight today: 242.5 lbs Weight change: 6 lbs Total weight lost: 85.0 lbs  Goal weight: 200 lbs  TANITA  BODY COMP RESULTS  06/04/12 12/17/12 01/09/13 02/13/13 03/13/13 05/15/13 07/15/13   BMI (kg/m^2) 43.2 41.8 39.4 36.7 35.7 32.8 32   Fat Mass (lbs) 174.0 178.5 158.0 146.5 135.5 116.0 111.5   Fat Free Mass (lbs) 153.5 138.0 140.5 132.0 135.0 132.5 131   Total Body Water (lbs) 112.5 101.0 103.0 96.5 99.0 97.0 96.0   24-Hour Dietary Recall B (6 AM): 1 pc ham and 1 pc cheese - 10-12g S (9 AM): NONE L: and  Sea salt veggie chips S:   Protein shake - 15 g  D: Two bites of chicken and mixed vegetables  10-15g S: NONE  Fluid intake:  50-68 oz water and one protein shake Estimated total protein intake:  ~30-40 g   Medications: See medication list.  Supplementation: Taking all (including iron) inconsistently  Using straws: No Drinking while eating: No Hair loss: Yes Carbonated beverages: No N/V/D/C: Constipation often. Taking stool softeners.  Dumping syndrome:  No  Recent physical activity:  Hasn't been walking lately  Samples given during visit include:   Premier Protein Shake: 10 ea Lot: [id number]; Exp: 12/01/13  N[place]obal Nasal B12 (500 mcg): 1 ea Lot: [id number]; Exp: 07/15  Progress Towards Goal(s):  In progress.  Nutritional Diagnosis:  [place]-3.3 Overweight/obesity related to past poor  dietary habits and physical inactivity as evidenced by patient w/ recent Gastric Sleeve surgery following dietary guidelines for continued weight loss.    Intervention:  Nutrition education. Encouraged Kendal Hymen to eat three meals and 2-3 snacks with protein to help with weight loss. Advised her to take all vitamins and consider adding Biotin since she is having some hair loss. Reviewed PhaseIIIB diet. Recommended adding any physical activity where she can for stress management and weight loss.   Monitoring/Evaluation:  Dietary intake, exercise, and body weight. Follow up in 3 months.

## 2013-07-15 NOTE — Patient Instructions (Addendum)
Goals:  Follow Phase 3B: High Protein + Non-Starchy Vegetables  AVOID meal skipping!!  Incorporate more solid food instead of protein shakes  Increase lean protein foods to meet 60-80g goal  Increase fluid intake to 64oz +  Avoid drinking 15 minutes before, during and 30 minutes after eating  Aim for >30 min of physical activity daily  Consider adding biotin to prevent hair loss  Take your vitamins and supplements   TANITA  BODY COMP RESULTS  06/04/12 12/17/12 01/09/13 02/13/13 03/13/13 05/15/13 07/15/2013   BMI (kg/m^2) 43.2 41.8 39.4 36.7 35.7 32.8 32   Fat Mass (lbs) 174.0 178.5 158.0 146.5 135.5 116.0 11.5   Fat Free Mass (lbs) 153.5 138.0 140.5 132.0 135.0 132.5 131   Total Body Water (lbs) 112.5 101.0 103.0 96.5 99.0 97.0 96

## 2013-07-18 LAB — VITAMIN D 1,25 DIHYDROXY: Vitamin D3 1, 25 (OH)2: 49 pg/mL

## 2013-10-19 ENCOUNTER — Ambulatory Visit: Payer: PRIVATE HEALTH INSURANCE | Admitting: *Deleted

## 2013-11-04 ENCOUNTER — Ambulatory Visit: Payer: PRIVATE HEALTH INSURANCE | Admitting: Dietician

## 2014-02-11 ENCOUNTER — Ambulatory Visit (INDEPENDENT_AMBULATORY_CARE_PROVIDER_SITE_OTHER): Payer: PRIVATE HEALTH INSURANCE | Admitting: Surgery

## 2014-03-25 ENCOUNTER — Ambulatory Visit (INDEPENDENT_AMBULATORY_CARE_PROVIDER_SITE_OTHER): Payer: Medicaid Other | Admitting: Surgery

## 2014-03-31 ENCOUNTER — Ambulatory Visit (INDEPENDENT_AMBULATORY_CARE_PROVIDER_SITE_OTHER): Payer: Medicaid Other | Admitting: Surgery

## 2014-06-16 ENCOUNTER — Other Ambulatory Visit: Payer: Self-pay

## 2014-06-16 ENCOUNTER — Ambulatory Visit (INDEPENDENT_AMBULATORY_CARE_PROVIDER_SITE_OTHER): Payer: Medicare HMO

## 2014-06-16 VITALS — BP 120/82 | HR 56 | Resp 18

## 2014-06-16 DIAGNOSIS — M722 Plantar fascial fibromatosis: Secondary | ICD-10-CM

## 2014-06-16 DIAGNOSIS — R52 Pain, unspecified: Secondary | ICD-10-CM

## 2014-06-16 MED ORDER — MELOXICAM 15 MG PO TABS
15.0000 mg | ORAL_TABLET | Freq: Every day | ORAL | Status: DC
Start: 2014-06-16 — End: 2014-06-16

## 2014-06-16 NOTE — Progress Notes (Signed)
   Subjective:    Patient ID: [name], female    DOB: 12/19/1970, 43 y.o.   MRN: [id number]  HPI I HAVE A KNOT IN THE ARCH OF MY RIGHT FOOT AND IT HAS BEEN GOING ON FOR ABOUT A MONTH AND SORE AND TENDER AND CAN'T PUT WEIGHT ON IT AND HAS SOME NUMBNESS    Review of Systems  Eyes: Positive for visual disturbance.  Musculoskeletal: Positive for back pain.  Neurological: Positive for headaches.  All other systems reviewed and are negative.      Objective:   Physical Exam 43 year old white female well-developed well-nourished oriented x3 presents at this time with a complaint of right mid arch nodular painful lesion on palpation medial band of the plantar fascia has thickening and mid arch right foot and there are painful and symptomatic for couple of months now. Patient wearing flip-flops indicates that bother her when she wore her Nike with gel. Patient otherwise wearing flip-flops are going barefoot.  Lower extremity objective findings as follows neurovascular status is intact with pedal pulses palpable DP postal for PT +2/4 bilateral capillary refill time 3 seconds all digits epicritic and proprioceptive sensations appear to be intact and symmetric bilateral patient does have a lumbar radiculopathy taking pain medications may also have some symptomology extending to the feet and legs as a result. Orthopedic biomechanical exam reveals pain on palpation of the medial band of plantar fascia there is a nodularity of the lesion about 270 nodule mid arch mid band right foot. X-rays reveal no osseous abnormalities no osseous contributed shin to this lesion is completely within the soft tissue medial band of the plantar fascia. There is mild inferior calcaneal spurring mild promontory changes noted on weightbearing on plain x-rays out osteoarthropathy subtalar and mid tarsus and Lisfranc joint otherwise unremarkable.      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 Plan at this time patient placed in fascial strapping recommend warm compress ice pack to [facility]. Patient will maintain meloxicam 15 mg once daily for of toe month or 2 if needed. This should not interfere with her other pain medications. Maintain good stable shoe he is crocs around the house avoid putting shoes flip-flops or barefoot walking. Patient is also advised to maintain warm compress and ice pack every evening as instructed fascial strapping applied for 5 days recheck within 2 weeks for followup may be candidate for steroid injection if no improvement  Alvan Dameichard Jordon Bourquin DPM

## 2014-06-16 NOTE — Patient Instructions (Signed)
    ICE INSTRUCTIONS  Apply ice or cold pack to the affected area at least 3 times a day for 10-15 minutes each time.  You should also use ice after prolonged activity or vigorous exercise.  Do not apply ice longer than 20 minutes at one time.  Always keep a cloth between your skin and the ice pack to prevent burns.  Being consistent and following these instructions will help control your symptoms.  We suggest you purchase a gel ice pack because they are reusable and do bit leak.  Some of them are designed to wrap around the area.  Use the method that works best for you.  Here are some other suggestions for icing.   Use a frozen bag of peas or corn-inexpensive and molds well to your body, usually stays frozen for 10 to 20 minutes.  Wet a towel with cold water and squeeze out the excess until it's damp.  Place in a bag in the freezer for 20 minutes. Then remove and use.  Alternate applying hot or warm compress for 10 minutes then ice pack for minutes repeat this 2 or 3 times every evening

## 2014-06-18 NOTE — Telephone Encounter (Signed)
Prescription was written for a 90 day supply.  Don't understand what the problem is.

## 2014-07-07 ENCOUNTER — Ambulatory Visit (INDEPENDENT_AMBULATORY_CARE_PROVIDER_SITE_OTHER): Payer: Medicare HMO

## 2014-07-07 VITALS — BP 107/68 | HR 82 | Resp 18

## 2014-07-07 DIAGNOSIS — R52 Pain, unspecified: Secondary | ICD-10-CM

## 2014-07-07 DIAGNOSIS — M722 Plantar fascial fibromatosis: Secondary | ICD-10-CM

## 2014-07-07 MED ORDER — TRIAMCINOLONE ACETONIDE 10 MG/ML IJ SUSP: 10.0000 mg | Freq: Once | INTRAMUSCULAR | Status: DC

## 2014-07-07 NOTE — Progress Notes (Signed)
   Subjective:    Patient ID: [name], female    DOB: 10/16/1971, 43 y.o.   MRN: [id number]  HPI IT IS STILL THE SAME ON THE ARCH OF MY RIGHT FOOT AND THE TAPING DIDN'T REALLY HELP    Review of Systems no new findings or systemic changes noted    Objective:   Physical Exam Vascular status is intact and unchanged pedal pulses palpable patient does have some problems with ingrown right great toenail addresses at a later date with possible AP nail procedure per request we'll followup at her next appointment. Over the plantar fasciitis plantar fibromatosis still has a painful nodule mid band medial arch right heel. Pain on direct lateral compression. Did have improvement with taping on temporarily with her for would benefit from functional orthoses continues to have tenderness no other changes in medications or status no new changes in history neurovascular status is intact pedal pulses palpable epicritic sensations intact and symmetric.       Assessment & Plan:  Assessment plantar fasciitis/fibroma medial right heel and arch at this time injection tender with Kenalog 20 mg Marcaine plain infiltrated the inferior calcaneal area along the directed to the medial band plantar fascia fibroma nodule. How this injection well fascial strapping was reapplied at this time and recommend followup with orthoses either OTC orthotics from on line and is on back, or can purchase orthotics are at the office such as power step orthotics or insoles. Followup in the next 2-4 weeks for reevaluation with plantar fasciitis however the stress use of orthoses are needed also will consider possible AP nail procedure for ingrowing right great toenail per patient request  Alvan Dameichard Jeana Kersting DPM

## 2014-07-07 NOTE — Patient Instructions (Signed)
ICE INSTRUCTIONS  Apply ice or cold pack to the affected area at least 3 times a day for 10-15 minutes each time.  You should also use ice after prolonged activity or vigorous exercise.  Do not apply ice longer than 20 minutes at one time.  Always keep a cloth between your skin and the ice pack to prevent burns.  Being consistent and following these instructions will help control your symptoms.  We suggest you purchase a gel ice pack because they are reusable and do bit leak.  Some of them are designed to wrap around the area.  Use the method that works best for you.  Here are some other suggestions for icing.   Use a frozen bag of peas or corn-inexpensive and molds well to your body, usually stays frozen for 10 to 20 minutes.  Wet a towel with cold water and squeeze out the excess until it's damp.  Place in a bag in the freezer for 20 minutes. Then remove and use.   Also need to obtain and use orthotics or arch supports. Recommended orthotic thickening to be obtained here at the office or can be purchased on [email or web address]. Are power step insoles or orthotic. The price for the orthotic here at the office is .$45.  This item is not covered by Medicare or Medicaid

## 2014-07-21 ENCOUNTER — Ambulatory Visit: Payer: Medicare Other

## 2014-10-11 IMAGING — CT CT ABD-PELV W/ CM
1 of 2 series · 15 of 32 positions shown, 19 images · IV contrast (OMNIPAQUE 300)
Comparison: 03/29/2012

CLINICAL DATA: Left upper quadrant pain.  Prior gastric sleeve
surgery.

CT ABDOMEN AND PELVIS WITH CONTRAST
TECHNIQUE: Multidetector CT imaging of the abdomen and pelvis was
performed following the standard protocol during bolus
administration of intravenous contrast.
Contrast: 50mL OMNIPAQUE IOHEXOL 300 MG/ML  SOLN, 100mL OMNIPAQUE
IOHEXOL 300 MG/ML  SOLN

[Series 2: abd/pel with · axial · 0.85mm/px · z∈[-644,-164]mm · 15 of 106 slices shown, 19 images]
[im 5/106  soft-tissue]
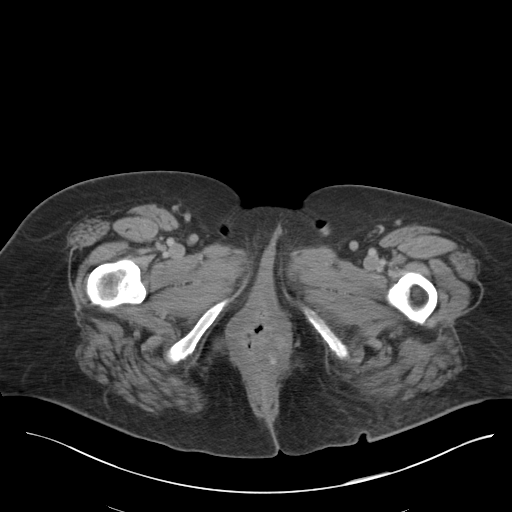
[im 5/106  bone]
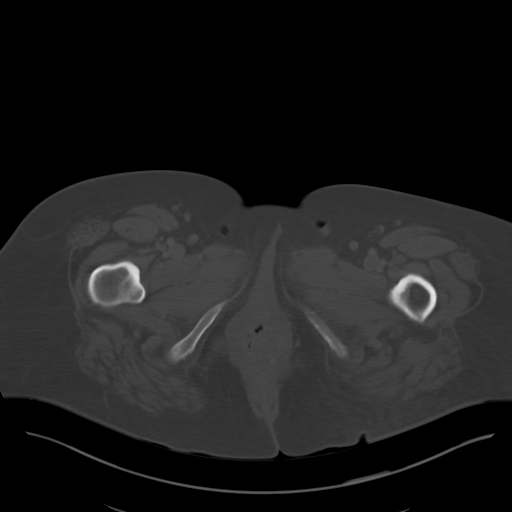
[im 13/106  soft-tissue]
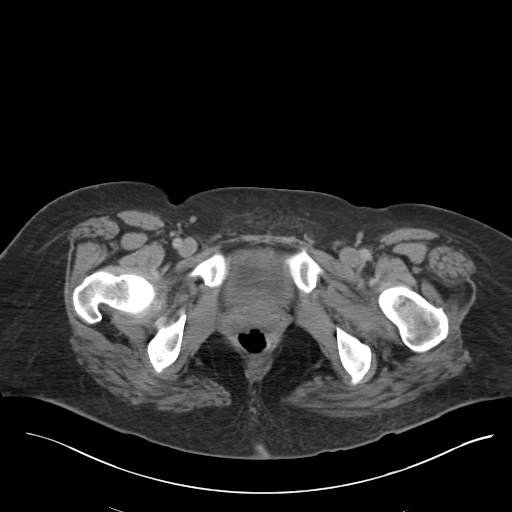
[im 21/106  soft-tissue]
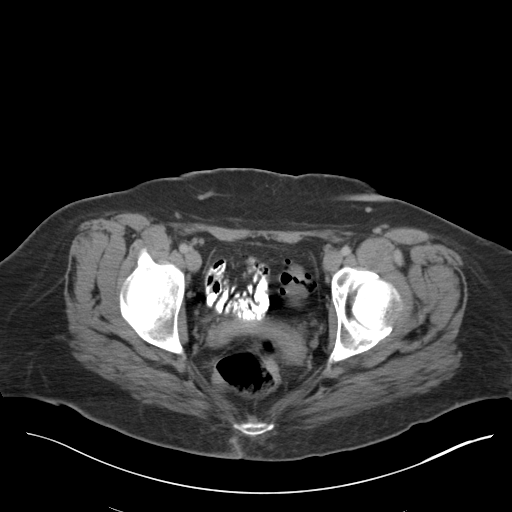
[im 29/106  soft-tissue]
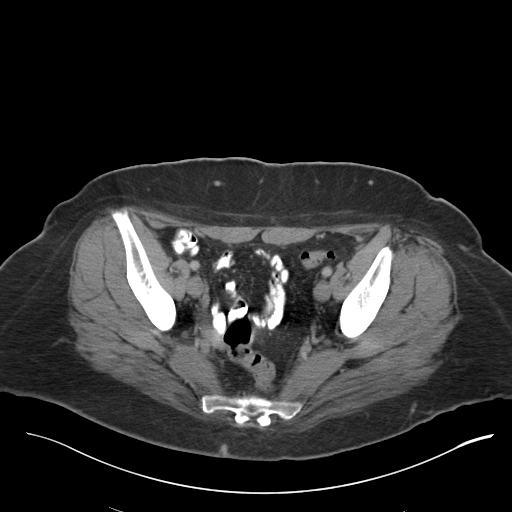
[im 37/106  soft-tissue]
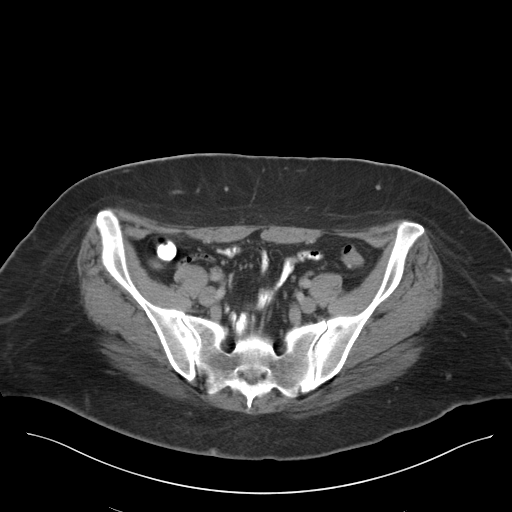
[im 45/106  soft-tissue]
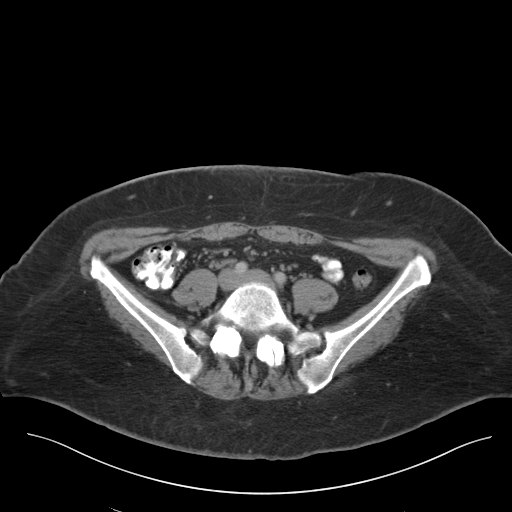
[im 53/106  soft-tissue]
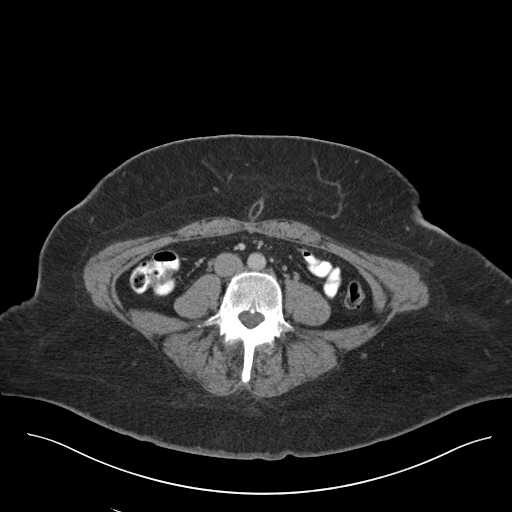
[im 61/106  soft-tissue]
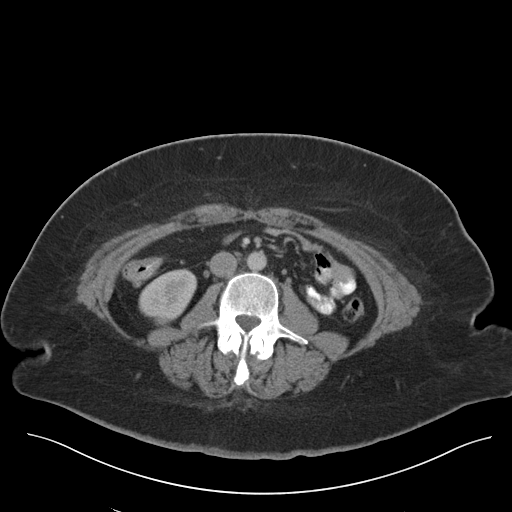
[im 69/106  soft-tissue]
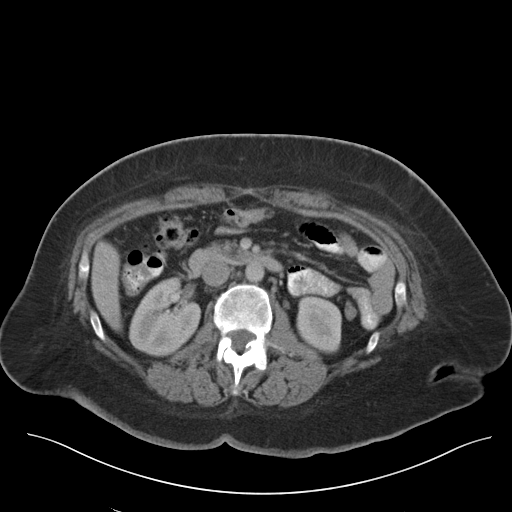
[im 69/106  bone]
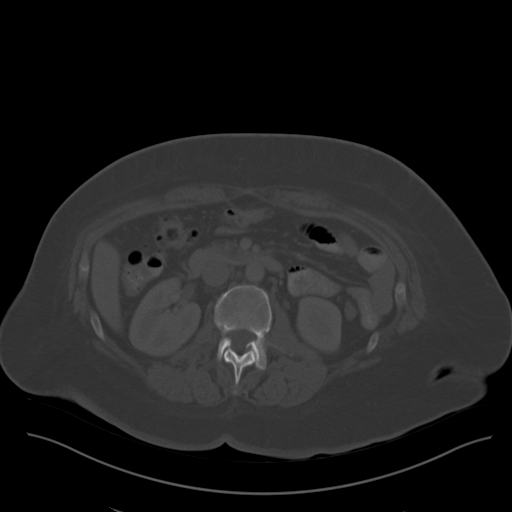
[im 77/106  soft-tissue]
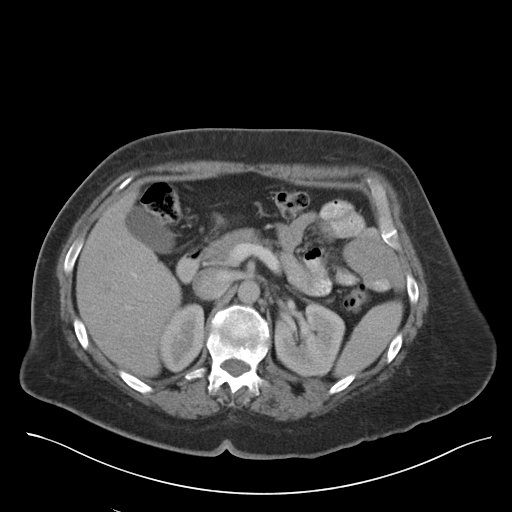
[im 85/106  soft-tissue]
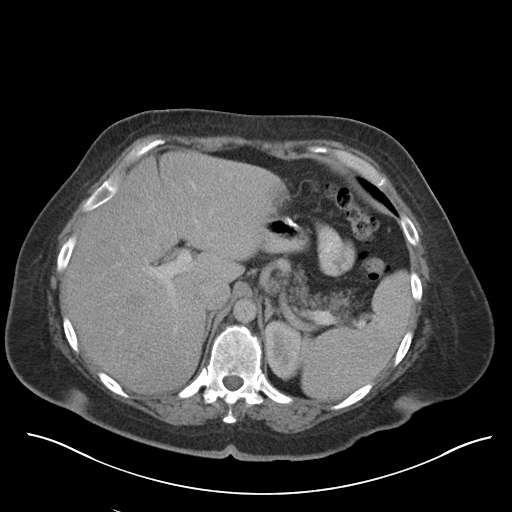
[im 89/106  lung]
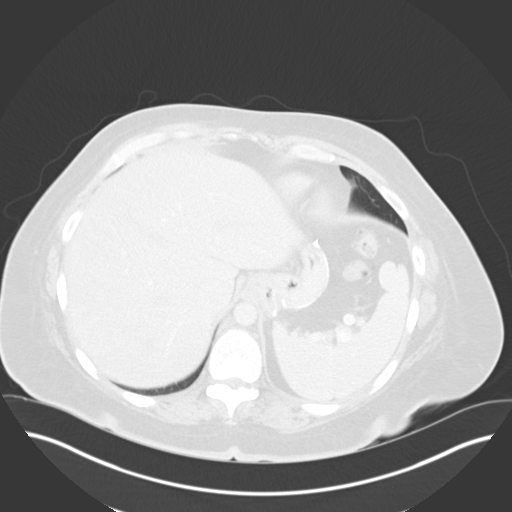
[im 93/106  soft-tissue]
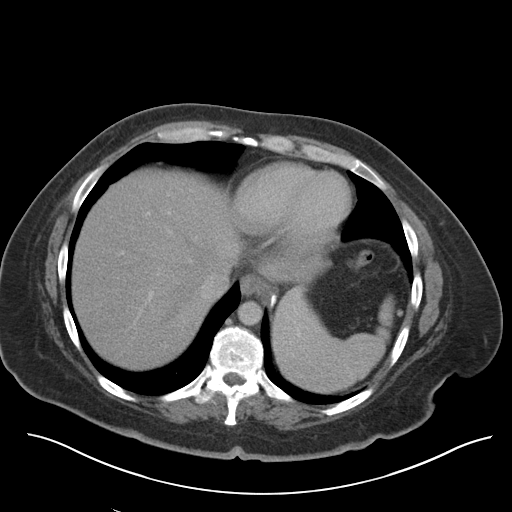
[im 93/106  lung]
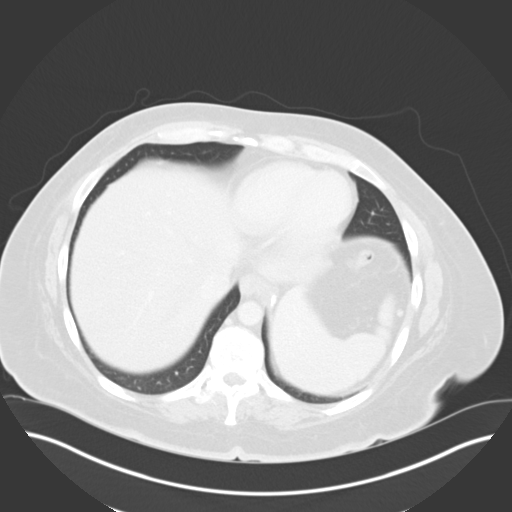
[im 97/106  lung]
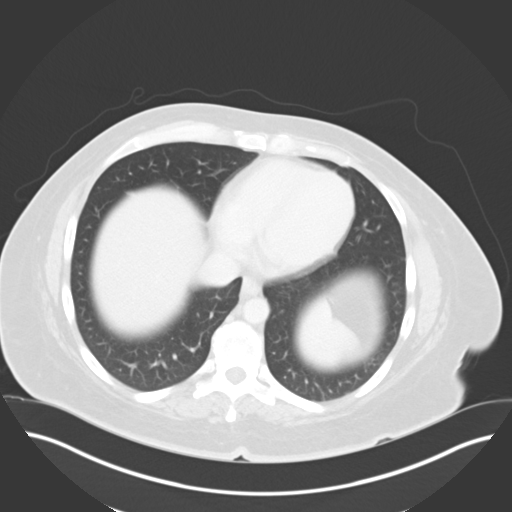
[im 101/106  soft-tissue]
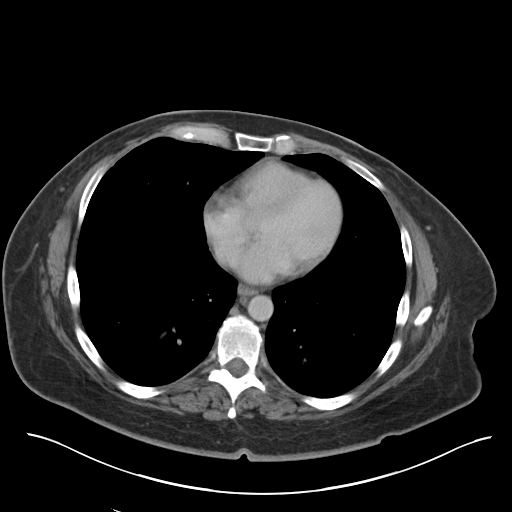
[im 101/106  lung]
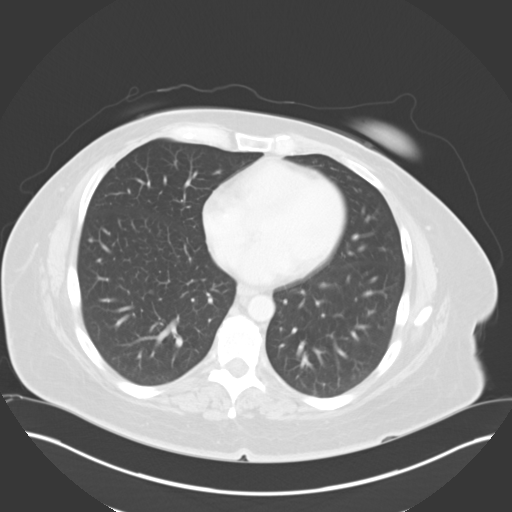

[15 of 32 positions shown; findings below may reference images not displayed]

FINDINGS: Lung bases are clear.  No effusions.  Heart is normal
size.

Changes of prior gastric sleeve noted along the greater curvature
of the stomach.  No complicating feature.  Liver, spleen, pancreas,
adrenals and kidneys are normal.  No hydronephrosis.  No renal or
ureteral stones.  Urinary bladder is decompressed.  Prior
hysterectomy.  Small right ovarian cyst or follicle.  Adnexa
unremarkable.

Appendix is visualized and is normal.  Large and small bowel
grossly unremarkable.  No free fluid, free air or adenopathy.  The
aorta is normal caliber.  No acute bony abnormality.  Stable old
mild compression fracture at T12 with associated degenerative
changes at T11-12.  Mild kyphosis at this level.
IMPRESSION: No acute findings in the abdomen or pelvis.  Evidence of prior
gastric sleeve procedure.

## 2015-11-20 DIAGNOSIS — J189 Pneumonia, unspecified organism: Secondary | ICD-10-CM

## 2015-11-20 HISTORY — DX: Pneumonia, unspecified organism: J18.9

## 2016-08-22 ENCOUNTER — Encounter (HOSPITAL_COMMUNITY): Payer: Self-pay

## 2016-12-21 HISTORY — PX: CALDWELL LUC: SHX1284

## 2017-07-25 ENCOUNTER — Encounter (HOSPITAL_COMMUNITY): Payer: Self-pay

## 2017-07-29 ENCOUNTER — Ambulatory Visit: Payer: Medicare Other | Admitting: Podiatry

## 2018-07-02 HISTORY — PX: LAPAROSCOPIC SIGMOID COLECTOMY: SHX5928

## 2018-07-14 ENCOUNTER — Encounter (HOSPITAL_COMMUNITY): Payer: Self-pay

## 2018-11-06 HISTORY — PX: CARPAL TUNNEL RELEASE: SHX101

## 2020-11-15 ENCOUNTER — Institutional Professional Consult (permissible substitution): Payer: Self-pay | Admitting: Plastic Surgery

## 2020-12-22 ENCOUNTER — Institutional Professional Consult (permissible substitution): Payer: Self-pay | Admitting: Plastic Surgery

## 2021-01-12 ENCOUNTER — Encounter: Payer: Self-pay | Admitting: Plastic Surgery

## 2021-01-12 ENCOUNTER — Other Ambulatory Visit: Payer: Self-pay

## 2021-01-12 ENCOUNTER — Ambulatory Visit (INDEPENDENT_AMBULATORY_CARE_PROVIDER_SITE_OTHER): Payer: Medicare Other | Admitting: Plastic Surgery

## 2021-01-12 VITALS — BP 138/92 | HR 98 | Ht 74.0 in | Wt 295.0 lb

## 2021-01-12 DIAGNOSIS — M793 Panniculitis, unspecified: Secondary | ICD-10-CM | POA: Diagnosis not present

## 2021-01-12 DIAGNOSIS — R1033 Periumbilical pain: Secondary | ICD-10-CM | POA: Diagnosis not present

## 2021-01-12 NOTE — Progress Notes (Signed)
Referring Provider Wilmer Floor., MD [address] [place],  [place] [address]   CC:  Chief Complaint  Patient presents with  . Advice Only      [name] is an 50 y.o. female.  HPI: Patient presents to discuss her abdominal contour.  She had bariatric surgery in 2014 and lost about 200 pounds.  She is currently around 300 pounds and has fluctuated a bit around that weight but that is essentially been her stable weight for the last 6 months at least.  She gets rashes beneath her folds and is interested in contouring her abdomen.  The only other abdominal surgery she had was a partial colectomy for diverticulitis.  She also is on chronic opioid treatment for chronic pain.  Her rashes have been refractory to over-the-counter and prescription treatments.  She stopped smoking years ago she is not diabetic and is not on any blood thinners.  No Active Allergies  Outpatient Encounter Medications as of 01/12/2021  Medication Sig Note  . ALPRAZolam (XANAX) 1 MG tablet  06/16/2014: Received from: External Pharmacy  . AMITIZA 24 MCG capsule  06/16/2014: Received from: External Pharmacy  . citalopram (CELEXA) 20 MG tablet    . esomeprazole (NEXIUM) 20 MG capsule Take 1 capsule (20 mg total) by mouth daily before breakfast.   . fluconazole (DIFLUCAN) 100 MG tablet Take 100 mg by mouth 3 (three) times daily.    . hydrochlorothiazide (HYDRODIURIL) 25 MG tablet Take 25 mg by mouth daily.   Marland Kitchen loratadine (CLARITIN) 10 MG tablet Take 10 mg by mouth daily.   Marland Kitchen losartan (COZAAR) 50 MG tablet Take 50 mg by mouth daily before breakfast.   . meloxicam (MOBIC) 15 MG tablet TAKE 1 TABLET BY MOUTH EVERY DAY   . Multiple Vitamin (MULTIVITAMIN WITH MINERALS) TABS Take 1 tablet by mouth 2 (two) times daily.    Marland Kitchen oxyCODONE (OXY IR/ROXICODONE) 5 MG immediate release tablet    . phentermine 37.5 MG capsule Take 37.5 mg by mouth every morning.   . sucralfate (CARAFATE) 1 G tablet Take 1 tablet  (1 g total) by mouth 4 (four) times daily.   . traMADol (ULTRAM) 50 MG tablet  06/16/2014: Received from: External Pharmacy  . valACYclovir (VALTREX) 1000 MG tablet    . VOLTAREN 1 % GEL    . cephALEXin (KEFLEX) 500 MG capsule  06/16/2014: Received from: External Pharmacy  . chlorhexidine (PERIDEX) 0.12 % solution  06/16/2014: Received from: External Pharmacy  . clotrimazole-betamethasone (LOTRISONE) cream  06/16/2014: Received from: External Pharmacy  . lidocaine (XYLOCAINE) 2 % solution  06/16/2014: Received from: External Pharmacy  . mupirocin ointment (BACTROBAN) 2 %  06/16/2014: Received from: External Pharmacy  . nystatin cream (MYCOSTATIN)    . potassium chloride (K-DUR,KLOR-CON) 10 MEQ tablet  06/16/2014: Received from: External Pharmacy  . sulfamethoxazole-trimethoprim (BACTRIM DS) 800-160 MG per tablet Take 1 tablet by mouth once.    . triamcinolone (KENALOG) 0.025 % cream     Facility-Administered Encounter Medications as of 01/12/2021  Medication  . triamcinolone acetonide (KENALOG) 10 MG/ML injection 10 mg     Past Medical History:  Diagnosis Date  . GERD (gastroesophageal reflux disease)   . Hiatal hernia    Per pt on 06/04/12  . Hypertension   . Morbid obesity (HCC)   . Sleep apnea    no cpap used    Past Surgical History:  Procedure Laterality Date  . ABDOMINAL HYSTERECTOMY  12 yrs ago  .  BREATH TEK H PYLORI  06/25/2012   Procedure: BREATH TEK H PYLORI;  Surgeon: Mariella Saa, MD;  Location: [facility];  Service: General;  Laterality: N/A;  Dr. Delice Lesch patient  . ESOPHAGOGASTRODUODENOSCOPY  12/23/2012   Procedure: ESOPHAGOGASTRODUODENOSCOPY (EGD);  Surgeon: Lodema Pilot, DO;  Location: [facility];  Service: General;  Laterality: N/A;  . HERNIA REPAIR    . INNER EAR SURGERY    . KNEE ARTHROSCOPY     left  . LAPAROSCOPIC GASTRIC SLEEVE RESECTION  12/23/2012   Procedure: LAPAROSCOPIC GASTRIC SLEEVE RESECTION;  Surgeon: Lodema Pilot, DO;  Location: [facility];  Service:  General;  Laterality: N/A;    Family History  Problem Relation Age of Onset  . Cancer Father        lymphoma  . Cancer Paternal Grandmother        breast    Social History   Social History Narrative  . Not on file  Denies tobacco use  Review of Systems General: Denies fevers, chills, weight loss CV: Denies chest pain, shortness of breath, palpitations  Physical Exam Vitals with BMI 01/12/2021 07/07/2014 06/16/2014  Height 6\' 2"  - -  Weight 295 lbs - -  BMI 37.86 - -  Systolic 138 107  Diastolic 92 68 82  Pulse 98 82 56  Some encounter information is confidential and restricted. Go to Review Flowsheets activity to see all data.    General:  No acute distress,  Alert and oriented, Non-Toxic, Normal speech and affect Abdomen: Abdomen is soft nontender.  She has some laparoscopic scars in the vertical midline scar which I assume is from her colectomy.  She has severe excess skin and adipose tissue throughout with an upper and a lower pannus.  I do not appreciate any obvious hernias but it is hard to tell.  Assessment/Plan Patient presents with a symptomatic pannus.  She is a reasonable candidate for fleur-de-lis panniculectomy given the upper and lower pannus and the degree of the skin excess.  She did not mention this but it looks like she has a history of her hernia repair and given her size and risk of recurrence I will send her for CT scan to ensure that there is no hernia prior to the operation.  I explained the operation to her in detail including the risks and benefits that include bleeding, infection, damage to surrounding structures and need for additional procedures.  I explained due to the excess that she has there would not be a perfect contour but it would be dramatically improved and I think should address the functional concerns and hygiene issues that bother her.  I explained that in general the drains would be in for 2 to 3 weeks postoperatively and her overall  recovery would probably be 6 to 8 weeks in total.  All her questions were answered and we will plan to move forward.  161 01/12/2021, 11:38 AM

## 2021-01-26 ENCOUNTER — Other Ambulatory Visit: Payer: Self-pay

## 2021-01-26 ENCOUNTER — Ambulatory Visit (HOSPITAL_COMMUNITY)
Admission: RE | Admit: 2021-01-26 | Discharge: 2021-01-26 | Disposition: A | Discharge status: Home / Self Care | Payer: Medicare Other | Source: Ambulatory Visit | Attending: Plastic Surgery | Admitting: Plastic Surgery

## 2021-01-26 DIAGNOSIS — R1033 Periumbilical pain: Secondary | ICD-10-CM | POA: Insufficient documentation

## 2021-02-02 ENCOUNTER — Encounter: Payer: Self-pay | Admitting: Plastic Surgery

## 2021-02-02 ENCOUNTER — Other Ambulatory Visit: Payer: Self-pay

## 2021-02-02 ENCOUNTER — Ambulatory Visit (INDEPENDENT_AMBULATORY_CARE_PROVIDER_SITE_OTHER): Payer: Medicare Other | Admitting: Plastic Surgery

## 2021-02-02 VITALS — BP 136/111 | HR 90 | Ht 73.0 in | Wt 292.0 lb

## 2021-02-02 DIAGNOSIS — M793 Panniculitis, unspecified: Secondary | ICD-10-CM

## 2021-02-02 MED ORDER — ONDANSETRON HCL 4 MG PO TABS: 4.0000 mg | ORAL_TABLET | Freq: Three times a day (TID) | ORAL | 0 refills | Status: DC | PRN

## 2021-02-02 NOTE — Progress Notes (Signed)
Referring Provider Courtney Paris, NP [address] [place],  [place] [address]   CC:  Chief Complaint  Patient presents with  . Pre-op Exam      [name] is an 50 y.o. female.  HPI: Patient presents for her preop appointment for upcoming fleur-de-lis panniculectomy.  She has had her CT scan which shows no hernia.  No Known Allergies  Outpatient Encounter Medications as of 02/02/2021  Medication Sig Note  . ALPRAZolam (XANAX) 1 MG tablet  06/16/2014: Received from: External Pharmacy  . AMITIZA 24 MCG capsule  06/16/2014: Received from: External Pharmacy  . cephALEXin (KEFLEX) 500 MG capsule  06/16/2014: Received from: External Pharmacy  . chlorhexidine (PERIDEX) 0.12 % solution  06/16/2014: Received from: External Pharmacy  . citalopram (CELEXA) 20 MG tablet    . clotrimazole-betamethasone (LOTRISONE) cream  06/16/2014: Received from: External Pharmacy  . esomeprazole (NEXIUM) 20 MG capsule Take 1 capsule (20 mg total) by mouth daily before breakfast.   . fluconazole (DIFLUCAN) 100 MG tablet Take 100 mg by mouth 3 (three) times daily.    . hydrochlorothiazide (HYDRODIURIL) 25 MG tablet Take 25 mg by mouth daily.   Marland Kitchen lidocaine (XYLOCAINE) 2 % solution  06/16/2014: Received from: External Pharmacy  . loratadine (CLARITIN) 10 MG tablet Take 10 mg by mouth daily.   Marland Kitchen losartan (COZAAR) 50 MG tablet Take 50 mg by mouth daily before breakfast.   . meloxicam (MOBIC) 15 MG tablet TAKE 1 TABLET BY MOUTH EVERY DAY   . Multiple Vitamin (MULTIVITAMIN WITH MINERALS) TABS Take 1 tablet by mouth 2 (two) times daily.    . mupirocin ointment (BACTROBAN) 2 %  06/16/2014: Received from: External Pharmacy  . nystatin cream (MYCOSTATIN)    . oxyCODONE (OXY IR/ROXICODONE) 5 MG immediate release tablet    . phentermine 37.5 MG capsule Take 37.5 mg by mouth every morning.   . potassium chloride (K-DUR,KLOR-CON) 10 MEQ tablet  06/16/2014: Received from: External Pharmacy  . sucralfate (CARAFATE) 1 G  tablet Take 1 tablet (1 g total) by mouth 4 (four) times daily.   Marland Kitchen sulfamethoxazole-trimethoprim (BACTRIM DS) 800-160 MG per tablet Take 1 tablet by mouth once.    . traMADol (ULTRAM) 50 MG tablet  06/16/2014: Received from: External Pharmacy  . triamcinolone (KENALOG) 0.025 % cream    . valACYclovir (VALTREX) 1000 MG tablet    . VOLTAREN 1 % GEL     Facility-Administered Encounter Medications as of 02/02/2021  Medication  . triamcinolone acetonide (KENALOG) 10 MG/ML injection 10 mg     Past Medical History:  Diagnosis Date  . GERD (gastroesophageal reflux disease)   . Hiatal hernia    Per pt on 06/04/12  . Hypertension   . Morbid obesity (HCC)   . Sleep apnea    no cpap used    Past Surgical History:  Procedure Laterality Date  . ABDOMINAL HYSTERECTOMY  12 yrs ago  . BREATH TEK H PYLORI  06/25/2012   Procedure: BREATH TEK H PYLORI;  Surgeon: Mariella Saa, MD;  Location: [facility];  Service: General;  Laterality: N/A;  Dr. Delice Lesch patient  . ESOPHAGOGASTRODUODENOSCOPY  12/23/2012   Procedure: ESOPHAGOGASTRODUODENOSCOPY (EGD);  Surgeon: Lodema Pilot, DO;  Location: [facility];  Service: General;  Laterality: N/A;  . HERNIA REPAIR    . INNER EAR SURGERY    . KNEE ARTHROSCOPY     left  . LAPAROSCOPIC GASTRIC SLEEVE RESECTION  12/23/2012   Procedure: LAPAROSCOPIC GASTRIC SLEEVE RESECTION;  Surgeon: Arlys John  [name], DO;  Location: [facility];  Service: General;  Laterality: N/A;    Family History  Problem Relation Age of Onset  . Cancer Father        lymphoma  . Cancer Paternal Grandmother        breast    Social History   Social History Narrative  . Not on file     Review of Systems General: Denies fevers, chills, weight loss CV: Denies chest pain, shortness of breath, palpitations  Physical Exam Vitals with BMI 02/02/2021 02/02/2021 01/12/2021  Height 6\' 1"  6\' 1"  6\' 2"   Weight 292 lbs 292 lbs 295 lbs  BMI 38.53 38.53 37.86  Systolic 136 167  Diastolic 111 119 92   Pulse 90 90 98  Some encounter information is confidential and restricted. Go to Review Flowsheets activity to see all data.    General:  No acute distress,  Alert and oriented, Non-Toxic, Normal speech and affect Cardiovascular: Regular rhythm Pulmonary: Unlabored Abdomen: Soft nontender  Assessment/Plan Patient presents for preoperative appointment for upcoming panniculectomy.  I explained again in detail the plan of the procedure along with the risks and benefits.  I explained that I would plan to do a vertical and horizontal excision given the amount of excess that she has.  I explained that this would improve her contour dramatically and address her functional symptoms but there were still be persistent contour irregularities given her preoperative appearance.  I explained the risk of the procedure that include bleeding, infection, damage to surrounding structures and need for additional procedures.  We discussed the potential for wound healing complications that might require dressing changes for period of time.  I explained the need for drains postoperatively and I would expect these to be in for 2 to 3 weeks.  I discussed the location and orientation of the scars.  She went through our consent form and had an opportunity to ask any questions all of which were answered.  I will plan to give her nausea medicine today and we will touch base with her current pain management provider to be on the same page regarding her perioperative narcotic prescriptions.  She is currently on 15 mg of oxycodone from them.  All her questions were answered.  02/02/2021, 4:27 PM

## 2021-02-02 NOTE — H&P (View-Only) (Signed)
 Referring Provider McCoy, Rachel, NP [address] [place],  [place] [address]   CC:  Chief Complaint  Patient presents with  . Pre-op Exam      [name] is an 50 y.o. female.  HPI: Patient presents for her preop appointment for upcoming fleur-de-lis panniculectomy.  She has had her CT scan which shows no hernia.  No Known Allergies  Outpatient Encounter Medications as of 02/02/2021  Medication Sig Note  . ALPRAZolam (XANAX) 1 MG tablet  06/16/2014: Received from: External Pharmacy  . AMITIZA 24 MCG capsule  06/16/2014: Received from: External Pharmacy  . cephALEXin (KEFLEX) 500 MG capsule  06/16/2014: Received from: External Pharmacy  . chlorhexidine (PERIDEX) 0.12 % solution  06/16/2014: Received from: External Pharmacy  . citalopram (CELEXA) 20 MG tablet    . clotrimazole-betamethasone (LOTRISONE) cream  06/16/2014: Received from: External Pharmacy  . esomeprazole (NEXIUM) 20 MG capsule Take 1 capsule (20 mg total) by mouth daily before breakfast.   . fluconazole (DIFLUCAN) 100 MG tablet Take 100 mg by mouth 3 (three) times daily.    . hydrochlorothiazide (HYDRODIURIL) 25 MG tablet Take 25 mg by mouth daily.   . lidocaine (XYLOCAINE) 2 % solution  06/16/2014: Received from: External Pharmacy  . loratadine (CLARITIN) 10 MG tablet Take 10 mg by mouth daily.   . losartan (COZAAR) 50 MG tablet Take 50 mg by mouth daily before breakfast.   . meloxicam (MOBIC) 15 MG tablet TAKE 1 TABLET BY MOUTH EVERY DAY   . Multiple Vitamin (MULTIVITAMIN WITH MINERALS) TABS Take 1 tablet by mouth 2 (two) times daily.    . mupirocin ointment (BACTROBAN) 2 %  06/16/2014: Received from: External Pharmacy  . nystatin cream (MYCOSTATIN)    . oxyCODONE (OXY IR/ROXICODONE) 5 MG immediate release tablet    . phentermine 37.5 MG capsule Take 37.5 mg by mouth every morning.   . potassium chloride (K-DUR,KLOR-CON) 10 MEQ tablet  06/16/2014: Received from: External Pharmacy  . sucralfate (CARAFATE) 1 G  tablet Take 1 tablet (1 g total) by mouth 4 (four) times daily.   . sulfamethoxazole-trimethoprim (BACTRIM DS) 800-160 MG per tablet Take 1 tablet by mouth once.    . traMADol (ULTRAM) 50 MG tablet  06/16/2014: Received from: External Pharmacy  . triamcinolone (KENALOG) 0.025 % cream    . valACYclovir (VALTREX) 1000 MG tablet    . VOLTAREN 1 % GEL     Facility-Administered Encounter Medications as of 02/02/2021  Medication  . triamcinolone acetonide (KENALOG) 10 MG/ML injection 10 mg     Past Medical History:  Diagnosis Date  . GERD (gastroesophageal reflux disease)   . Hiatal hernia    Per pt on 06/04/12  . Hypertension   . Morbid obesity (HCC)   . Sleep apnea    no cpap used    Past Surgical History:  Procedure Laterality Date  . ABDOMINAL HYSTERECTOMY  12 yrs ago  . BREATH TEK H PYLORI  06/25/2012   Procedure: BREATH TEK H PYLORI;  Surgeon: Benjamin T Hoxworth, MD;  Location: [facility];  Service: General;  Laterality: N/A;  Dr. layton's patient  . ESOPHAGOGASTRODUODENOSCOPY  12/23/2012   Procedure: ESOPHAGOGASTRODUODENOSCOPY (EGD);  Surgeon: Brian Layton, DO;  Location: [facility];  Service: General;  Laterality: N/A;  . HERNIA REPAIR    . INNER EAR SURGERY    . KNEE ARTHROSCOPY     left  . LAPAROSCOPIC GASTRIC SLEEVE RESECTION  12/23/2012   Procedure: LAPAROSCOPIC GASTRIC SLEEVE RESECTION;  Surgeon: Brian   [name], DO;  Location: [facility];  Service: General;  Laterality: N/A;    Family History  Problem Relation Age of Onset  . Cancer Father        lymphoma  . Cancer Paternal Grandmother        breast    Social History   Social History Narrative  . Not on file     Review of Systems General: Denies fevers, chills, weight loss CV: Denies chest pain, shortness of breath, palpitations  Physical Exam Vitals with BMI 02/02/2021 02/02/2021 01/12/2021  Height 6\' 1"  6\' 1"  6\' 2"   Weight 292 lbs 292 lbs 295 lbs  BMI 38.53 38.53 37.86  Systolic 136 167  Diastolic 111 119 92   Pulse 90 90 98  Some encounter information is confidential and restricted. Go to Review Flowsheets activity to see all data.    General:  No acute distress,  Alert and oriented, Non-Toxic, Normal speech and affect Cardiovascular: Regular rhythm Pulmonary: Unlabored Abdomen: Soft nontender  Assessment/Plan Patient presents for preoperative appointment for upcoming panniculectomy.  I explained again in detail the plan of the procedure along with the risks and benefits.  I explained that I would plan to do a vertical and horizontal excision given the amount of excess that she has.  I explained that this would improve her contour dramatically and address her functional symptoms but there were still be persistent contour irregularities given her preoperative appearance.  I explained the risk of the procedure that include bleeding, infection, damage to surrounding structures and need for additional procedures.  We discussed the potential for wound healing complications that might require dressing changes for period of time.  I explained the need for drains postoperatively and I would expect these to be in for 2 to 3 weeks.  I discussed the location and orientation of the scars.  She went through our consent form and had an opportunity to ask any questions all of which were answered.  I will plan to give her nausea medicine today and we will touch base with her current pain management provider to be on the same page regarding her perioperative narcotic prescriptions.  She is currently on 15 mg of oxycodone from them.  All her questions were answered.  02/02/2021, 4:27 PM

## 2021-02-15 ENCOUNTER — Other Ambulatory Visit: Payer: Self-pay | Admitting: Plastic Surgery

## 2021-02-15 ENCOUNTER — Telehealth: Payer: Self-pay

## 2021-02-15 MED ORDER — PROMETHAZINE HCL 12.5 MG PO TABS: 12.5000 mg | ORAL_TABLET | Freq: Three times a day (TID) | ORAL | 0 refills | Status: DC | PRN

## 2021-02-15 NOTE — Telephone Encounter (Signed)
Called pharmacy, verified with [name] the Zofran is not covered by patients insurance. Does not show other anti-nausea medication that is covered by insurance. Will try phenergan. Forwarding message to [place].  Will discuss with Dr. Arita Miss in regards to pain medication and will call patient back tomorrow.

## 2021-02-15 NOTE — Telephone Encounter (Signed)
Patient left phone message saying that she is having surgery on Monday and the pharmacy needs approval from Dr. Arita Miss in order for her to have her nausea medicine.  Patient also said that Dr. Arita Miss said that he was going to try to give her something for breakthrough pain if it was ok with her primary care doctor.  Patient said that her primary care doctor said that it was ok.  **Patient said that her preferred pharmacy is the [facility] on [address] in [place].   Please call.

## 2021-02-16 NOTE — Telephone Encounter (Signed)
Called patient, per Dr. Arita Miss he will send in her pain medication to pharmacy the day prior or, day of surgery. Patient understood and agreed.

## 2021-02-17 ENCOUNTER — Other Ambulatory Visit (HOSPITAL_COMMUNITY)
Admission: RE | Admit: 2021-02-17 | Discharge: 2021-02-17 | Disposition: A | Discharge status: Home / Self Care | Payer: Medicare Other | Source: Ambulatory Visit | Attending: Plastic Surgery | Admitting: Plastic Surgery

## 2021-02-17 ENCOUNTER — Encounter (HOSPITAL_COMMUNITY): Payer: Self-pay | Admitting: Plastic Surgery

## 2021-02-17 ENCOUNTER — Other Ambulatory Visit: Payer: Self-pay

## 2021-02-17 DIAGNOSIS — Z01812 Encounter for preprocedural laboratory examination: Secondary | ICD-10-CM | POA: Diagnosis present

## 2021-02-17 DIAGNOSIS — Z20822 Contact with and (suspected) exposure to covid-19: Secondary | ICD-10-CM | POA: Diagnosis not present

## 2021-02-17 NOTE — Progress Notes (Signed)
PCP - [facility] [address] - Dr. Courtney Paris Cardiologist - denies  PPM/ICD - denies   Chest x-ray - denies EKG - DOS Stress Test -  requesting ECHO - denies Cardiac Cath - denies  Sleep Study - requested CPAP - does not use    Aspirin Instructions: pt states she stopped aspirin on 02/12/21  ERAS Protcol - clears until 0430 - pt verbalizes understanding   COVID TEST- 02/17/21 - pending result   Anesthesia review:  no  Patient denies shortness of breath, fever, cough and chest pain at PAT appointment   All instructions explained to the patient, with a verbal understanding of the material. Patient also instructed to self quarantine after being tested for COVID-19. The opportunity to ask questions was provided.

## 2021-02-17 NOTE — Progress Notes (Signed)
Anesthesia Chart Review:  Pt is a same day work up    Case: [id number] Date/Time: 02/20/21 0715   Procedure: Fleur-di-lis panniculectomy (N/A Abdomen)   Anesthesia type: General   Pre-op diagnosis: panniculitis   [facility]: [facility] ROOM 08 / [facility]   Surgeons: Allena Napoleon, MD      DISCUSSION: Pt is 50 years old with hx HTN, OSA, COPD. Former smoker (quit 2014).    PROVIDERS: - PCP is Courtney Paris, NP who referred pt for surgery   LABS: Will be obtained day of surgery   IMAGES:  CT abd/pelvis 01/26/21:  - Midline anterior abdominal wall diastasis and bulging without frank herniation at the level of the umbilicus containing a loop of small bowel. No associated obstruction pattern or incarceration. This has a similar appearance compared to 06/29/2019. - Stable small hiatal hernia. Postop changes of gastric sleeve and hysterectomy. - Suspect hepatic steatosis.   EKG: Will be obtained day of surgery    CV: Nuclear stress test 09/18/2018 [facility]): 1.  Resting EKG shows normal sinus rhythm. 2.  Resting and stress EKG shows normal ST segments; no ventricular tachycardia, significant QRS prolongation or heart block. 3.  Both the rest and stress images are within normal limits.  No significant reversible ischemia or fixed scar. 4.  Gated LVEF is normal 57%.  Normal LV segmental wall motion  Echocardiogram 09/15/2018 [facility]): 1.  LA mildly dilated 2.  Otherwise normal cardiac chamber sizes and function; normal valve anatomy and function; no pericardial effusion or intracardiac mass.  No intracardiac shunts by two-dimensional and color flow imaging.  Normal thoracic aorta and aortic arch     Past Medical History:  Diagnosis Date  . Anxiety   . Arthritis   . Asthma   . COPD (chronic obstructive pulmonary disease) (HCC)   . Diverticulitis of sigmoid colon   . GERD (gastroesophageal reflux disease)   . Hiatal hernia    Per pt on 06/04/12  .  Hypertension   . Hypothyroidism   . Morbid obesity (HCC)   . Pneumonia 2017  . Sleep apnea    no cpap used    Past Surgical History:  Procedure Laterality Date  . ABDOMINAL HYSTERECTOMY  12 yrs ago  . BREATH TEK H PYLORI  06/25/2012   Procedure: BREATH TEK H PYLORI;  Surgeon: Mariella Saa, MD;  Location: [facility];  Service: General;  Laterality: N/A;  Dr. Delice Lesch patient  . CALDWELL LUC Right 12/21/2016  . CARPAL TUNNEL RELEASE Left 11/06/2018  . COLONOSCOPY    . ESOPHAGOGASTRODUODENOSCOPY  12/23/2012   Procedure: ESOPHAGOGASTRODUODENOSCOPY (EGD);  Surgeon: Lodema Pilot, DO;  Location: [facility];  Service: General;  Laterality: N/A;  . HERNIA REPAIR    . INNER EAR SURGERY    . KNEE ARTHROSCOPY     left  . LAPAROSCOPIC GASTRIC SLEEVE RESECTION  12/23/2012   Procedure: LAPAROSCOPIC GASTRIC SLEEVE RESECTION;  Surgeon: Lodema Pilot, DO;  Location: [facility];  Service: General;  Laterality: N/A;  . LAPAROSCOPIC SIGMOID COLECTOMY  07/02/2018  . TONSILLECTOMY AND ADENOIDECTOMY      MEDICATIONS: . triamcinolone acetonide (KENALOG) 10 MG/ML injection 10 mg   . albuterol (VENTOLIN HFA) 108 (90 Base) MCG/ACT inhaler  . ALPRAZolam (XANAX) 0.5 MG tablet  . aspirin 325 MG tablet  . atorvastatin (LIPITOR) 20 MG tablet  . dicyclomine (BENTYL) 20 MG tablet  . esomeprazole (NEXIUM) 20 MG capsule  . estrogen, conjugated,-medroxyprogesterone (PREMPRO) 0.3-1.5 MG tablet  .  famotidine (PEPCID) 40 MG tablet  . levothyroxine (SYNTHROID) 50 MCG tablet  . LINZESS 290 MCG CAPS capsule  . losartan (COZAAR) 50 MG tablet  . Multiple Vitamin (MULTIVITAMIN WITH MINERALS) TABS  . propranolol (INDERAL) 20 MG tablet  . tiZANidine (ZANAFLEX) 4 MG tablet  . Vitamin D, Ergocalciferol, (DRISDOL) 1.25 MG (50000 UNIT) CAPS capsule  . meloxicam (MOBIC) 15 MG tablet  . ondansetron (ZOFRAN) 4 MG tablet  . promethazine (PHENERGAN) 12.5 MG tablet  . sucralfate (CARAFATE) 1 G tablet  . traMADol (ULTRAM) 50 MG  tablet    If labs acceptable day of surgery, I anticipate pt can proceed with surgery as scheduled.  Rica Mast, PhD, FNP-BC [facility]/Anesthesiology Phone: [phone] 02/17/2021 3:34 PM

## 2021-02-17 NOTE — Anesthesia Preprocedure Evaluation (Addendum)
Anesthesia Evaluation  Patient identified by MRN, date of birth, ID band Patient awake    Reviewed: Allergy & Precautions, NPO status , Patient's Chart, lab work & pertinent test results  Airway Mallampati: II  TM Distance: >3 FB     Dental   Pulmonary asthma , sleep apnea , pneumonia, COPD, former smoker,    breath sounds clear to auscultation       Cardiovascular hypertension,  Rhythm:Regular Rate:Normal     Neuro/Psych Anxiety    GI/Hepatic Neg liver ROS, hiatal hernia, GERD  ,  Endo/Other  negative endocrine ROSHypothyroidism   Renal/GU negative Renal ROS     Musculoskeletal   Abdominal   Peds  Hematology   Anesthesia Other Findings   Reproductive/Obstetrics                           Anesthesia Physical Anesthesia Plan  ASA: III  Anesthesia Plan: General   Post-op Pain Management:    Induction: Intravenous  PONV Risk Score and Plan: 3 and Dexamethasone, Ondansetron and Midazolam  Airway Management Planned: Oral ETT  Additional Equipment:   Intra-op Plan:   Post-operative Plan: Possible Post-op intubation/ventilation  Informed Consent: I have reviewed the patients History and Physical, chart, labs and discussed the procedure including the risks, benefits and alternatives for the proposed anesthesia with the patient or authorized representative who has indicated his/her understanding and acceptance.       Plan Discussed with: Anesthesiologist and CRNA  Anesthesia Plan Comments: (See APP note by Joslyn Hy, FNP )      Anesthesia Quick Evaluation

## 2021-02-18 LAB — SARS CORONAVIRUS 2 (TAT 6-24 HRS): SARS Coronavirus 2: NEGATIVE

## 2021-02-20 ENCOUNTER — Ambulatory Visit (HOSPITAL_COMMUNITY): Payer: Medicare Other | Admitting: Emergency Medicine

## 2021-02-20 ENCOUNTER — Encounter (HOSPITAL_COMMUNITY)
Admission: RE | Disposition: A | Discharge status: Home / Self Care | Payer: Self-pay | Source: Ambulatory Visit | Attending: Plastic Surgery

## 2021-02-20 ENCOUNTER — Ambulatory Visit (HOSPITAL_COMMUNITY)
Admission: RE | Admit: 2021-02-20 | Discharge: 2021-02-20 | Disposition: A | Discharge status: Home / Self Care | Payer: Medicare Other | Source: Ambulatory Visit | Attending: Plastic Surgery | Admitting: Plastic Surgery

## 2021-02-20 DIAGNOSIS — Z79899 Other long term (current) drug therapy: Secondary | ICD-10-CM | POA: Diagnosis not present

## 2021-02-20 DIAGNOSIS — Z793 Long term (current) use of hormonal contraceptives: Secondary | ICD-10-CM | POA: Diagnosis not present

## 2021-02-20 DIAGNOSIS — Z79891 Long term (current) use of opiate analgesic: Secondary | ICD-10-CM | POA: Diagnosis not present

## 2021-02-20 DIAGNOSIS — Z6837 Body mass index (BMI) 37.0-37.9, adult: Secondary | ICD-10-CM | POA: Insufficient documentation

## 2021-02-20 DIAGNOSIS — Z79890 Hormone replacement therapy (postmenopausal): Secondary | ICD-10-CM | POA: Insufficient documentation

## 2021-02-20 DIAGNOSIS — Z87891 Personal history of nicotine dependence: Secondary | ICD-10-CM | POA: Diagnosis not present

## 2021-02-20 DIAGNOSIS — M793 Panniculitis, unspecified: Secondary | ICD-10-CM | POA: Insufficient documentation

## 2021-02-20 DIAGNOSIS — Z791 Long term (current) use of non-steroidal anti-inflammatories (NSAID): Secondary | ICD-10-CM | POA: Diagnosis not present

## 2021-02-20 HISTORY — PX: PANNICULECTOMY: SHX5360

## 2021-02-20 HISTORY — DX: Unspecified osteoarthritis, unspecified site: M19.90

## 2021-02-20 HISTORY — DX: Anxiety disorder, unspecified: F41.9

## 2021-02-20 HISTORY — DX: Unspecified asthma, uncomplicated: J45.909

## 2021-02-20 HISTORY — DX: Diverticulitis of large intestine without perforation or abscess without bleeding: K57.32

## 2021-02-20 HISTORY — DX: Hypothyroidism, unspecified: E03.9

## 2021-02-20 HISTORY — DX: Chronic obstructive pulmonary disease, unspecified: J44.9

## 2021-02-20 LAB — CBC
HCT: 40.3 % (ref 36.0–46.0)
Hemoglobin: 13.2 g/dL (ref 12.0–15.0)
MCH: 30 pg (ref 26.0–34.0)
MCHC: 32.8 g/dL (ref 30.0–36.0)
MCV: 91.6 fL (ref 80.0–100.0)
Platelets: 223 10*3/uL (ref 150–400)
RBC: 4.4 MIL/uL (ref 3.87–5.11)
RDW: 14.3 % (ref 11.5–15.5)
WBC: 6.9 10*3/uL (ref 4.0–10.5)
nRBC: 0 % (ref 0.0–0.2)

## 2021-02-20 LAB — BASIC METABOLIC PANEL
Anion gap: 8 (ref 5–15)
BUN: 10 mg/dL (ref 6–20)
CO2: 26 mmol/L (ref 22–32)
Calcium: 8.8 mg/dL — ABNORMAL LOW (ref 8.9–10.3)
Chloride: 105 mmol/L (ref 98–111)
Creatinine, Ser: 0.68 mg/dL (ref 0.44–1.00)
GFR, Estimated: >60 mL/min (ref >60)
Glucose, Bld: 97 mg/dL (ref 70–99)
Potassium: 4.1 mmol/L (ref 3.5–5.1)
Sodium: 139 mmol/L (ref 135–145)

## 2021-02-20 SURGERY — PANNICULECTOMY
Anesthesia: General | Site: Abdomen

## 2021-02-20 MED ORDER — PHENYLEPHRINE 40 MCG/ML (10ML) SYRINGE FOR IV PUSH (FOR BLOOD PRESSURE SUPPORT): PREFILLED_SYRINGE | INTRAVENOUS | Status: DC | PRN

## 2021-02-20 MED ORDER — LACTATED RINGERS IV SOLN: INTRAVENOUS | Status: DC | PRN

## 2021-02-20 MED ORDER — PROPOFOL 10 MG/ML IV BOLUS
INTRAVENOUS | Status: DC | PRN
  Administered 2021-02-20: 170 mg via INTRAVENOUS

## 2021-02-20 MED ORDER — ROCURONIUM BROMIDE 10 MG/ML (PF) SYRINGE
PREFILLED_SYRINGE | INTRAVENOUS | Status: DC | PRN
  Administered 2021-02-20: 40 mg via INTRAVENOUS
  Administered 2021-02-20: 60 mg via INTRAVENOUS
  Administered 2021-02-20: 20 mg via INTRAVENOUS

## 2021-02-20 MED ORDER — PROPOFOL 10 MG/ML IV BOLUS
INTRAVENOUS | Status: AC
  Filled 2021-02-20: qty 20

## 2021-02-20 MED ORDER — ONDANSETRON HCL 4 MG/2ML IJ SOLN
INTRAMUSCULAR | Status: DC | PRN
  Administered 2021-02-20: 4 mg via INTRAVENOUS

## 2021-02-20 MED ORDER — HYDROCODONE-ACETAMINOPHEN 5-325 MG PO TABS: 1.0000 | ORAL_TABLET | ORAL | 0 refills | Status: DC | PRN

## 2021-02-20 MED ORDER — FENTANYL CITRATE (PF) 100 MCG/2ML IJ SOLN
INTRAMUSCULAR | Status: AC
  Filled 2021-02-20: qty 2

## 2021-02-20 MED ORDER — FENTANYL CITRATE (PF) 250 MCG/5ML IJ SOLN
INTRAMUSCULAR | Status: AC
  Filled 2021-02-20: qty 5

## 2021-02-20 MED ORDER — ORAL CARE MOUTH RINSE: 15.0000 mL | Freq: Once | OROMUCOSAL | Status: AC

## 2021-02-20 MED ORDER — SODIUM BICARBONATE 4.2 % IV SOLN
INTRAVENOUS | Status: AC
  Administered 2021-02-20: 1000 mL via INTRAMUSCULAR
  Filled 2021-02-20: qty 50

## 2021-02-20 MED ORDER — CEFAZOLIN SODIUM-DEXTROSE 2-4 GM/100ML-% IV SOLN
INTRAVENOUS | Status: AC
  Filled 2021-02-20: qty 100

## 2021-02-20 MED ORDER — BUPIVACAINE LIPOSOME 1.3 % IJ SUSP
INTRAMUSCULAR | Status: AC
  Filled 2021-02-20: qty 20

## 2021-02-20 MED ORDER — MIDAZOLAM HCL 2 MG/2ML IJ SOLN
INTRAMUSCULAR | Status: DC | PRN
  Administered 2021-02-20: 2 mg via INTRAVENOUS

## 2021-02-20 MED ORDER — DEXAMETHASONE SODIUM PHOSPHATE 10 MG/ML IJ SOLN
INTRAMUSCULAR | Status: DC | PRN
  Administered 2021-02-20: 10 mg via INTRAVENOUS

## 2021-02-20 MED ORDER — FENTANYL CITRATE (PF) 250 MCG/5ML IJ SOLN
INTRAMUSCULAR | Status: DC | PRN
  Administered 2021-02-20: 100 ug via INTRAVENOUS
  Administered 2021-02-20: 50 ug via INTRAVENOUS
  Administered 2021-02-20: 25 ug via INTRAVENOUS
  Administered 2021-02-20: 50 ug via INTRAVENOUS
  Administered 2021-02-20: 75 ug via INTRAVENOUS

## 2021-02-20 MED ORDER — LIDOCAINE 2% (20 MG/ML) 5 ML SYRINGE
INTRAMUSCULAR | Status: AC
  Filled 2021-02-20: qty 5

## 2021-02-20 MED ORDER — OXYCODONE HCL 5 MG PO TABS
5.0000 mg | ORAL_TABLET | Freq: Once | ORAL | Status: AC
Start: 2021-02-20 — End: 2021-02-20
  Administered 2021-02-20: 5 mg via ORAL

## 2021-02-20 MED ORDER — FENTANYL CITRATE (PF) 100 MCG/2ML IJ SOLN
25.0000 ug | INTRAMUSCULAR | Status: DC | PRN
  Administered 2021-02-20: 25 ug via INTRAVENOUS
  Administered 2021-02-20: 50 ug via INTRAVENOUS
  Administered 2021-02-20: 25 ug via INTRAVENOUS

## 2021-02-20 MED ORDER — LACTATED RINGERS IV SOLN: INTRAVENOUS | Status: DC

## 2021-02-20 MED ORDER — LIDOCAINE 2% (20 MG/ML) 5 ML SYRINGE
INTRAMUSCULAR | Status: DC | PRN
  Administered 2021-02-20: 80 mg via INTRAVENOUS

## 2021-02-20 MED ORDER — CEFAZOLIN SODIUM-DEXTROSE 2-3 GM-%(50ML) IV SOLR
INTRAVENOUS | Status: DC | PRN
  Administered 2021-02-20: 2 g via INTRAVENOUS

## 2021-02-20 MED ORDER — CEFAZOLIN SODIUM-DEXTROSE 2-4 GM/100ML-% IV SOLN: 2.0000 g | INTRAVENOUS | Status: DC

## 2021-02-20 MED ORDER — OXYCODONE HCL 5 MG PO TABS
ORAL_TABLET | ORAL | Status: AC
  Filled 2021-02-20: qty 1

## 2021-02-20 MED ORDER — ONDANSETRON HCL 4 MG/2ML IJ SOLN
INTRAMUSCULAR | Status: AC
  Filled 2021-02-20: qty 2

## 2021-02-20 MED ORDER — SUGAMMADEX SODIUM 200 MG/2ML IV SOLN
INTRAVENOUS | Status: DC | PRN
  Administered 2021-02-20: 300 mg via INTRAVENOUS

## 2021-02-20 MED ORDER — CHLORHEXIDINE GLUCONATE 0.12 % MT SOLN
15.0000 mL | Freq: Once | OROMUCOSAL | Status: AC
  Administered 2021-02-20: 15 mL via OROMUCOSAL
  Filled 2021-02-20: qty 15

## 2021-02-20 MED ORDER — MIDAZOLAM HCL 2 MG/2ML IJ SOLN
INTRAMUSCULAR | Status: AC
  Filled 2021-02-20: qty 2

## 2021-02-20 MED ORDER — DEXAMETHASONE SODIUM PHOSPHATE 10 MG/ML IJ SOLN
INTRAMUSCULAR | Status: AC
  Filled 2021-02-20: qty 1

## 2021-02-20 MED ORDER — BUPIVACAINE-EPINEPHRINE 0.5% -1:200000 IJ SOLN
INTRAMUSCULAR | Status: AC
  Filled 2021-02-20: qty 1

## 2021-02-20 MED ORDER — SODIUM CHLORIDE 0.9 % IR SOLN
Status: DC | PRN
  Administered 2021-02-20: 1000 mL

## 2021-02-20 MED ORDER — SUGAMMADEX SODIUM 500 MG/5ML IV SOLN
INTRAVENOUS | Status: AC
  Filled 2021-02-20: qty 5

## 2021-02-20 MED ORDER — SODIUM BICARBONATE 4.2 % IV SOLN
INTRAVENOUS | Status: DC
  Filled 2021-02-20: qty 50

## 2021-02-20 MED ORDER — SODIUM BICARBONATE 4.2 % IV SOLN
INTRAVENOUS | Status: AC
  Filled 2021-02-20: qty 50

## 2021-02-20 SURGICAL SUPPLY — 61 items
APPLIER CLIP 9.375 MED OPEN (MISCELLANEOUS) ×2
BINDER ABDOMINAL 12 ML 46-62 (SOFTGOODS) ×2 IMPLANT
BIOPATCH RED 1 DISK 7.0 (GAUZE/BANDAGES/DRESSINGS) ×4 IMPLANT
BLADE CLIPPER SURG (BLADE) IMPLANT
BLADE SURG 11 STRL SS (BLADE) ×2 IMPLANT
BLADE SURG 15 STRL LF DISP TIS (BLADE) IMPLANT
BLADE SURG 15 STRL SS (BLADE)
CANISTER SUCT 3000ML PPV (MISCELLANEOUS) ×2 IMPLANT
CHLORAPREP W/TINT 26 (MISCELLANEOUS) ×2 IMPLANT
CLIP APPLIE 9.375 MED OPEN (MISCELLANEOUS) ×1 IMPLANT
CLSR STERI-STRIP ANTIMIC 1/2X4 (GAUZE/BANDAGES/DRESSINGS) ×6 IMPLANT
CONT SPEC PATH 64OZ SNAP LID (MISCELLANEOUS) ×2 IMPLANT
COVER WAND RF STERILE (DRAPES) ×2 IMPLANT
DERMABOND ADVANCED (GAUZE/BANDAGES/DRESSINGS) ×1
DERMABOND ADVANCED .7 DNX12 (GAUZE/BANDAGES/DRESSINGS) ×1 IMPLANT
DRAIN CHANNEL 15F RND FF W/TCR (WOUND CARE) ×4 IMPLANT
DRAIN CHANNEL 19F RND (DRAIN) IMPLANT
DRAPE TOP ARMCOVERS (MISCELLANEOUS) ×2 IMPLANT
DRAPE U-SHAPE 76X120 STRL (DRAPES) ×4 IMPLANT
DRAPE UTILITY XL STRL (DRAPES) ×2 IMPLANT
ELECT BLADE 4.0 EZ CLEAN MEGAD (MISCELLANEOUS) ×2
ELECT COATED BLADE 2.86 ST (ELECTRODE) ×2 IMPLANT
ELECT REM PT RETURN 9FT ADLT (ELECTROSURGICAL) ×2
ELECTRODE BLDE 4.0 EZ CLN MEGD (MISCELLANEOUS) ×1 IMPLANT
ELECTRODE REM PT RTRN 9FT ADLT (ELECTROSURGICAL) ×1 IMPLANT
EVACUATOR SILICONE 100CC (DRAIN) ×4 IMPLANT
GAUZE SPONGE 4X4 12PLY STRL (GAUZE/BANDAGES/DRESSINGS) ×2 IMPLANT
GAUZE XEROFORM 1X8 LF (GAUZE/BANDAGES/DRESSINGS) ×2 IMPLANT
GAUZE XEROFORM 5X9 LF (GAUZE/BANDAGES/DRESSINGS) ×2 IMPLANT
GLOVE BIOGEL M STRL SZ7.5 (GLOVE) ×2 IMPLANT
GLOVE SRG 8 PF TXTR STRL LF DI (GLOVE) ×1 IMPLANT
GLOVE SURG UNDER POLY LF SZ8 (GLOVE) ×2
GOWN STRL REUS W/ TWL LRG LVL3 (GOWN DISPOSABLE) ×2 IMPLANT
GOWN STRL REUS W/TWL LRG LVL3 (GOWN DISPOSABLE) ×4
KIT BASIN OR (CUSTOM PROCEDURE TRAY) ×2 IMPLANT
NEEDLE HYPO 25GX1X1/2 BEV (NEEDLE) ×2 IMPLANT
NS IRRIG 1000ML POUR BTL (IV SOLUTION) ×2 IMPLANT
PACK GENERAL/GYN (CUSTOM PROCEDURE TRAY) ×2 IMPLANT
PAD ABD 8X10 STRL (GAUZE/BANDAGES/DRESSINGS) ×10 IMPLANT
PENCIL SMOKE EVACUATOR (MISCELLANEOUS) ×2 IMPLANT
PIN SAFETY STERILE (MISCELLANEOUS) ×2 IMPLANT
SHEET MEDIUM DRAPE 40X70 STRL (DRAPES) ×4 IMPLANT
SLEEVE SCD COMPRESS KNEE MED (STOCKING) ×2 IMPLANT
SPONGE LAP 18X18 RF (DISPOSABLE) ×8 IMPLANT
STAPLER INSORB 30 2030 C-SECTI (MISCELLANEOUS) ×4 IMPLANT
STAPLER VISISTAT 35W (STAPLE) ×2 IMPLANT
SUT ETHILON 2 0 FS 18 (SUTURE) ×4 IMPLANT
SUT ETHILON 3 0 PS 1 (SUTURE) ×4 IMPLANT
SUT MNCRL AB 3-0 PS2 27 (SUTURE) ×4 IMPLANT
SUT MNCRL AB 4-0 PS2 18 (SUTURE) ×4 IMPLANT
SUT PDS AB 0 CT 36 (SUTURE) ×4 IMPLANT
SUT PDS AB 2-0 CT2 27 (SUTURE) IMPLANT
SUT VIC AB 2-0 CTX 27 (SUTURE) ×4 IMPLANT
SUT VLOC 180 3-0 9IN GS21 (SUTURE) ×6 IMPLANT
SUT VLOC 90 P-14 23 (SUTURE) ×2 IMPLANT
SYR BULB IRRIG 60ML STRL (SYRINGE) ×2 IMPLANT
SYR CONTROL 10ML LL (SYRINGE) ×2 IMPLANT
TRAY FOLEY W/BAG SLVR 16FR (SET/KITS/TRAYS/PACK)
TRAY FOLEY W/BAG SLVR 16FR ST (SET/KITS/TRAYS/PACK) IMPLANT
TUBING INFILTRATION IT-10001 (TUBING) ×2 IMPLANT
UNDERPAD 30X36 HEAVY ABSORB (UNDERPADS AND DIAPERS) ×2 IMPLANT

## 2021-02-20 NOTE — Transfer of Care (Signed)
Immediate Anesthesia Transfer of Care Note  Patient: [name]  Procedure(s) Performed: Fleur-di-lis panniculectomy (N/A Abdomen)  Patient Location: PACU  Anesthesia Type:General  Level of Consciousness: drowsy  Airway & Oxygen Therapy: Patient Spontanous Breathing and Patient connected to face mask oxygen  Post-op Assessment: Report given to RN and Post -op Vital signs reviewed and stable  Post vital signs: Reviewed and stable  Last Vitals:  Vitals Value Taken Time  BP 137/87   Temp    Pulse 78   Resp 16   SpO2 99%     Last Pain:  Vitals:   02/20/21 0632  TempSrc:   PainSc: 0-No pain      Patients Stated Pain Goal: 4 (02/20/21 6979)  Complications: No complications documented.

## 2021-02-20 NOTE — Progress Notes (Signed)
Pacu Discharge Note  Patient instuctions were given to son Shari Prows. Wound care, diet, pain, follow up care and how and whom to contact with concerns were discussed. Family aware someone needs to remain with patient overnight and concerns after receiving anesthesia and what to avoid and safety. Answered all questions and concerns. Discussed use of abdominal binder.   Discharge paperwork has clear contact informations for surgeon and 24 hour RN line for concerns.   Discussed what concerns to look for including infection and signs/symptoms to look for.   IV was removed prior to discharge. Patient was brought to car with belongings.   Pt exits my care.

## 2021-02-20 NOTE — Anesthesia Procedure Notes (Signed)
Procedure Name: Intubation Date/Time: 02/20/2021 7:48 AM Performed by: Bryson Corona, CRNA Pre-anesthesia Checklist: Patient identified, Emergency Drugs available, Suction available and Patient being monitored Patient Re-evaluated:Patient Re-evaluated prior to induction Oxygen Delivery Method: Circle System Utilized Preoxygenation: Pre-oxygenation with 100% oxygen Induction Type: IV induction Ventilation: Mask ventilation without difficulty Laryngoscope Size: Mac and 3 Grade View: Grade I Tube type: Oral Tube size: 7.0 mm Number of attempts: 1 Airway Equipment and Method: Stylet Placement Confirmation: ETT inserted through vocal cords under direct vision,  positive ETCO2 and breath sounds checked- equal and bilateral Secured at: 22 cm Tube secured with: Tape Dental Injury: Teeth and Oropharynx as per pre-operative assessment

## 2021-02-20 NOTE — Interval H&P Note (Signed)
Patient presents for surgery today. She was seen and examined. Risks and benefits discussed. Proceed with surgery.

## 2021-02-20 NOTE — Anesthesia Postprocedure Evaluation (Signed)
Anesthesia Post Note  Patient: [name]  Procedure(s) Performed: Fleur-di-lis panniculectomy (N/A Abdomen)     Patient location during evaluation: PACU Anesthesia Type: General Level of consciousness: awake Pain management: pain level controlled Vital Signs Assessment: post-procedure vital signs reviewed and stable Respiratory status: spontaneous breathing Cardiovascular status: stable Postop Assessment: no apparent nausea or vomiting Anesthetic complications: no   No complications documented.  Last Vitals:  Vitals:   02/20/21 1045 02/20/21 1050  BP: 137/84 137/84  Pulse: 71 69  Resp: 18 16  Temp:    SpO2: 92% 95%    Last Pain:  Vitals:   02/20/21 1045  TempSrc:   PainSc: 0-No pain                 Dalisa Forrer

## 2021-02-20 NOTE — Op Note (Signed)
Operative Note   DATE OF OPERATION: 02/20/2021  SURGICAL DEPARTMENT: Plastic Surgery  PREOPERATIVE DIAGNOSES: Panniculitis  POSTOPERATIVE DIAGNOSES:  same  PROCEDURE: Fleur-de-lis panniculectomy  SURGEON: Ancil Linsey, MD  ASSISTANT: Zadie Cleverly, PA The advanced practice practitioner (APP) assisted throughout the case.  The APP was essential in retraction and counter traction when needed to make the case progress smoothly.  This retraction and assistance made it possible to see the tissue plans for the procedure.  The assistance was needed for blood control, tissue re-approximation and assisted with closure of the incision site.  ANESTHESIA:  General.   COMPLICATIONS: None.   INDICATIONS FOR PROCEDURE:  The patient, [name] is a 50 y.o. female born on [birth year], is here for treatment of panniculitis MRN: [id number]  CONSENT:  Informed consent was obtained directly from the patient. Risks, benefits and alternatives were fully discussed. Specific risks including but not limited to bleeding, infection, hematoma, seroma, scarring, pain, contracture, asymmetry, wound healing problems, and need for further surgery were all discussed. The patient did have an ample opportunity to have questions answered to satisfaction.   DESCRIPTION OF PROCEDURE:  The patient was taken to the operating room. SCDs were placed and antibiotics were given.  General anesthesia was administered.  The patient's operative site was prepped and draped in a sterile fashion. A time out was performed and all information was confirmed to be correct.  I started by tailor tacking the skin to plan the vertical component of the excision.  I then marked this out with a marking pen and infused 3 L of tumescent solution into the abdomen.  The inferior incision had been marked out about 8 cm superior to the vulvar commissure and extended laterally in a symmetric fashion.  At this point I made the inferior incision  with a 10 blade and dissected down the fascia with cautery.  I then incised along the marks for the vertical component of the excision with a 10 blade and dissected down to the fascia with cautery.  The umbilicus was dissected out using 11 blade and dissected down to the fascia with cautery.  The vertical component of the excision was then performed excising the soft tissues down to the level of the fascia and this was passed off the field.  Meticulous hemostasis was obtained in the vertical incision was stapled closed.  Undermining was done laterally to free up any points of tethering in the horizontal and inferior portions of the excision were then planned on each side.  They were excised with a 10 blade and cautery and meticulous hemostasis was obtained.  2 JP drains were left and secured with nylon sutures.  At this point the wound was stapled closed check for size size shape and symmetry which were all good.  Weight of the specimen was 5360 g.  I then inset the umbilicus by incising along the vertical incision to make a circular hole it was inset with Monocryl sutures.  The remainder of the closure was done with interrupted buried 2-0 Vicryl sutures tacking Scarpa's fascia down to the deep fascia followed by Enzor stapler and running 3 oh V-Loc.  This gave a nice on table result.  Wound was then dressed with Steri-Strips 4 x 4's ABDs and a compressive wrap.  The patient tolerated the procedure well.  There were no complications. The patient was allowed to wake from anesthesia, extubated and taken to the recovery room in satisfactory condition.

## 2021-02-20 NOTE — Discharge Instructions (Signed)
Activity As tolerated: NO showers for 3 days No heavy activities  Diet: Regular  Wound Care: Keep dressing clean & dry for 3 days.  Keep wrap applied with compression as much as possible.  Empty and record drain output.    Do not change dressings for 3 days unless soiled.  Can change if needed but make sure to reapply wrap. After three days can remove wrap and shower.  Then reapply dressings if needed and continue compression with wrap or soft sports bra. Call doctor if any unusual problems occur such as pain, excessive bleeding, unrelieved nausea/vomiting, fever &/or chills   Follow-up appointment: Scheduled for next week.   Panniculectomy, Care After The following information offers guidance on how to care for yourself after your procedure. Your health care provider may also give you more specific instructions. If you have problems or questions, contact your health care provider. What can I expect after the procedure? After the procedure, it is common to have:  Pain, redness, swelling, and bruising around your incisions.  Some drainage of fluid or blood from your incisions.  Scarring. Follow these instructions at home: Medicines  Take over-the-counter and prescription medicines only as told by your health care provider.  If you were prescribed an antibiotic medicine, take it as told by your health care provider. Do not stop using the antibiotic even if you start to feel better.  Ask your health care provider if the medicine prescribed to you: ? Requires you to avoid driving or using machinery. ? Can cause constipation. You may need to take these actions to prevent or treat constipation:  Drink enough fluid to keep your urine pale yellow.  Take over-the-counter or prescription medicines.  Eat foods that are high in fiber, such as beans, whole grains, and fresh fruits and vegetables.  Limit foods that are high in fat and processed sugars, such as fried or sweet  foods. Incision care  Follow instructions from your health care provider about how to take care of your incisions. Make sure you: ? Wash your hands with soap and water for at least 20 seconds before and after you change your bandages (dressings). If soap and water are not available, use hand sanitizer. ? Change your dressings as told by your health care provider. ? Leave stitches (sutures), skin glue, or adhesive strips in place. These skin closures may need to stay in place for 2 weeks or longer. If adhesive strip edges start to loosen and curl up, you may trim the loose edges. Do not remove adhesive strips completely unless your health care provider tells you to do that.  Check your incision areas every day for signs of infection. Check for: ? More redness, swelling, or pain. ? More fluid or blood. ? Warmth. ? Pus or a bad smell.   Activity  Rest as told by your health care provider.  Do not lift anything that is heavier than 10 lb (4.5 kg), or the limit that you are told, until your health care provider says that it is safe.  Avoid activity that requires a lot of energy, such as exercise or sports, for at least 6 weeks or as told by your health care provider. Ask your health care provider what activities are safe for you.  Avoid sexual activity until your health care provider says it is safe.  Return to your normal activities as told by your health care provider. Lifestyle  Do not use any products that contain nicotine or tobacco. These products  include cigarettes, chewing tobacco, and vaping devices, such as e-cigarettes. These can delay incision healing after surgery. If you need help quitting, ask your health care provider.  Do not drink alcohol. General instructions  Follow instructions from your health care provider about how to take care of your drainage tubes. Do not remove your drainage tubes unless your health care provider tells you to do this.  If you were given an  abdominal binder or a girdle, wear it at all times, except when bathing. This includes when you are sleeping. Wear your abdominal binder or girdle for as long as told by your health care provider.  Wear compression stockings as told by your health care provider. These stockings help to prevent blood clots and reduce swelling in your legs.  Keep all follow-up visits. This is important. Contact a health care provider if:  You have more redness, swelling, or pain around any incision.  You have more fluid or blood coming from any incision.  Any incision feels warm to the touch.  You have a fever or chills.  You have pus or a bad smell coming from any incision.  You have a cough.  A fluid pocket develops under your skin (seroma) and it is getting larger or causing pain. Seromas often go away on their own. Get help right away if:  Any incisions start to break open.  You have severe pain in your chest or abdomen.  You have problems with breathing.  You have nausea and vomiting that do not go away. These symptoms may represent a serious problem that is an emergency. Do not wait to see if the symptoms will go away. Get medical help right away. Call your local emergency services (911 in the [place] Do not drive yourself to the hospital. Summary  After your panniculectomy, it is common to have pain, swelling, and bruising.  Check your incision areas every day for signs of infection.  Do not remove your drainage tubes unless your health care provider tells you to do this. This information is not intended to replace advice given to you by your health care provider. Make sure you discuss any questions you have with your health care provider. Document Revised: 07/27/2020 Document Reviewed: 07/27/2020 Elsevier Patient Education  2021 Elsevier Inc.   Surgical [facility] Care Surgical drains are used to remove extra fluid that normally builds up in a surgical wound after surgery. A surgical  drain helps to heal a surgical wound. Different kinds of surgical drains include:  Active drains. These drains use suction to pull drainage away from the surgical wound. Drainage flows through a tube to a container outside of the body. With these drains, you need to keep the bulb or the drainage container flat (compressed) at all times, except while you empty it. Flattening the bulb or container creates suction.  Passive drains. These drains allow fluid to drain naturally, by gravity. Drainage flows through a tube to a bandage (dressing) or a container outside of the body. Passive drains do not need to be emptied. A drain is placed during surgery. Right after surgery, drainage is usually bright red and a little thicker than water. The drainage may gradually turn yellow or pink and become thinner. It is likely that your health care provider will remove the drain when the drainage stops or when the amount decreases to 1-2 Tbsp (15-30 mL) during a 24-hour period. Supplies needed:  Tape.  Germ-free cleaning solution (sterile saline).  Cotton swabs.  Split  gauze drain sponge: 4 x 4 inches (10 x 10 cm).  Gauze square: 4 x 4 inches (10 x 10 cm). How to care for your surgical drain Care for your drain as told by your health care provider. This is important to help prevent infection. If your drain is placed at your back, or any other hard-to-reach area, ask another person to assist you in performing the following tasks: General care  Keep the skin around the drain dry and covered with a dressing at all times.  Check your drain area every day for signs of infection. Check for: ? Redness, swelling, or pain. ? Pus or a bad smell. ? Cloudy drainage. ? Tenderness or pressure at the drain exit site. Changing the dressing Follow instructions from your health care provider about how to change your dressing. Change your dressing at least once a day. Change it more often if needed to keep the dressing dry.  Make sure you: 1. Gather your supplies. 2. Wash your hands with soap and water before you change your dressing. If soap and water are not available, use hand sanitizer. 3. Remove the old dressing. Avoid using scissors to do that. 4. Wash your hands with soap and water again after removing the old dressing. 5. Use sterile saline to clean your skin around the drain. You may need to use a cotton swab to clean the skin. 6. Place the tube through the slit in a drain sponge. Place the drain sponge so that it covers your wound. 7. Place the gauze square or another drain sponge on top of the drain sponge that is on the wound. Make sure the tube is between those layers. 8. Tape the dressing to your skin. 9. Tape the drainage tube to your skin 1-2 inches (2.5-5 cm) below the place where the tube enters your body. Taping keeps the tube from pulling on any stitches (sutures) that you have. 10. Wash your hands with soap and water. 11. Write down the color of your drainage and how often you change your dressing. How to empty your active drain 1. Make sure that you have a measuring cup that you can empty your drainage into. 2. Wash your hands with soap and water. If soap and water are not available, use hand sanitizer. 3. Loosen any pins or clips that hold the tube in place. 4. If your health care provider tells you to strip the tube to prevent clots and tube blockages: ? Hold the tube at the skin with one hand. Use your other hand to pinch the tubing with your thumb and first finger. ? Gently move your fingers down the tube while squeezing very lightly. This clears any drainage, clots, or tissue from the tube. ? You may need to do this several times each day to keep the tube clear. Do not pull on the tube. 5. Open the bulb cap or the drain plug. Do not touch the inside of the cap or the bottom of the plug. 6. Turn the device upside down and gently squeeze. 7. Empty all of the drainage into the measuring  cup. 8. Compress the bulb or the container and replace the cap or the plug. To compress the bulb or the container, squeeze it firmly in the middle while you close the cap or plug the container. 9. Write down the amount of drainage that you have in each 24-hour period. If you have less than 2 Tbsp (30 mL) of drainage during 24 hours, contact your health  care provider. 10. Flush the drainage down the toilet. 11. Wash your hands with soap and water.   Contact a health care provider if:  You have redness, swelling, or pain around your drain area.  You have pus or a bad smell coming from your drain area.  You have a fever or chills.  The skin around your drain is warm to the touch.  The amount of drainage that you have is increasing instead of decreasing.  You have drainage that is cloudy.  There is a sudden stop or a sudden decrease in the amount of drainage that you have.  Your drain tube falls out.  Your active drain does not stay compressed after you empty it. Summary  Surgical drains are used to remove extra fluid that normally builds up in a surgical wound after surgery.  Different kinds of surgical drains include active drains and passive drains. Active drains use suction to pull drainage away from the surgical wound, and passive drains allow fluid to drain naturally.  It is important to care for your drain to prevent infection. If your drain is placed at your back, or any other hard-to-reach area, ask another person to assist you.  Contact your health care provider if you have redness, swelling, or pain around your drain area. This information is not intended to replace advice given to you by your health care provider. Make sure you discuss any questions you have with your health care provider. Document Revised: 12/10/2018 Document Reviewed: 12/10/2018 Elsevier Patient Education  2021 Elsevier Inc.   Surgical Drain Record  Empty your surgical drain as told by your health  care provider. Use this form to write down the amount of fluid that has collected in the drainage container. Bring this form with you to your follow-up visits. Surgical drain #1 location: ___________________ Date __________ Time __________ Amount __________ Date __________ Time __________ Amount __________ Date __________ Time __________ Amount __________ Date __________ Time __________ Amount __________ Date __________ Time __________ Amount __________ Date __________ Time __________ Amount __________ Date __________ Time __________ Amount __________ Date __________ Time __________ Amount __________ Date __________ Time __________ Amount __________ Date __________ Time __________ Amount __________ Date __________ Time __________ Amount __________ Date __________ Time __________ Amount __________ Date __________ Time __________ Amount __________ Date __________ Time __________ Amount __________ Date __________ Time __________ Amount __________ Date __________ Time __________ Amount __________ Date __________ Time __________ Amount __________ Date __________ Time __________ Amount __________ Date __________ Time __________ Amount __________ Date __________ Time __________ Amount __________ Date __________ Time __________ Amount __________ Surgical drain #2 location: ___________________ Date __________ Time __________ Amount __________ Date __________ Time __________ Amount __________ Date __________ Time __________ Amount __________ Date __________ Time __________ Amount __________ Date __________ Time __________ Amount __________ Date __________ Time __________ Amount __________ Date __________ Time __________ Amount __________ Date __________ Time __________ Amount __________ Date __________ Time __________ Amount __________ Date __________ Time __________ Amount __________ Date __________ Time __________ Amount __________ Date __________ Time __________ Amount __________ Date  __________ Time __________ Amount __________ Date __________ Time __________ Amount __________ Date __________ Time __________ Amount __________ Date __________ Time __________ Amount __________ Date __________ Time __________ Amount __________ Date __________ Time __________ Amount __________ Date __________ Time __________ Amount __________ Date __________ Time __________ Amount __________ Date __________ Time __________ Amount __________ This information is not intended to replace advice given to you by your health care provider. Make sure you discuss any questions you have with your health care  provider. Document Revised: 12/21/2019 Document Reviewed: 08/12/2017 Elsevier Patient Education  2021 Elsevier Inc.   Monitored Anesthesia Care, Care After This sheet gives you information about how to care for yourself after your procedure. Your health care provider may also give you more specific instructions. If you have problems or questions, contact your health care provider. What can I expect after the procedure? After the procedure, it is common to have:  Tiredness.  Forgetfulness about what happened after the procedure.  Impaired judgment for important decisions.  Nausea or vomiting.  Some difficulty with balance. Follow these instructions at home: For the time period you were told by your health care provider:  Rest as needed.  Do not participate in activities where you could fall or become injured.  Do not drive or use machinery.  Do not drink alcohol.  Do not take sleeping pills or medicines that cause drowsiness.  Do not make important decisions or sign legal documents.  Do not take care of children on your own.      Eating and drinking  Follow the diet that is recommended by your health care provider.  Drink enough fluid to keep your urine pale yellow.  If you vomit: ? Drink water, juice, or soup when you can drink without vomiting. ? Make sure you have  little or no nausea before eating solid foods. General instructions  Have a responsible adult stay with you for the time you are told. It is important to have someone help care for you until you are awake and alert.  Take over-the-counter and prescription medicines only as told by your health care provider.  If you have sleep apnea, surgery and certain medicines can increase your risk for breathing problems. Follow instructions from your health care provider about wearing your sleep device: ? Anytime you are sleeping, including during daytime naps. ? While taking prescription pain medicines, sleeping medicines, or medicines that make you drowsy.  Avoid smoking.  Keep all follow-up visits as told by your health care provider. This is important. Contact a health care provider if:  You keep feeling nauseous or you keep vomiting.  You feel light-headed.  You are still sleepy or having trouble with balance after 24 hours.  You develop a rash.  You have a fever.  You have redness or swelling around the IV site. Get help right away if:  You have trouble breathing.  You have new-onset confusion at home. Summary  For several hours after your procedure, you may feel tired. You may also be forgetful and have poor judgment.  Have a responsible adult stay with you for the time you are told. It is important to have someone help care for you until you are awake and alert.  Rest as told. Do not drive or operate machinery. Do not drink alcohol or take sleeping pills.  Get help right away if you have trouble breathing, or if you suddenly become confused. This information is not intended to replace advice given to you by your health care provider. Make sure you discuss any questions you have with your health care provider. Document Revised: 07/21/2020 Document Reviewed: 10/08/2019 Elsevier Patient Education  2021 [employer].

## 2021-02-20 NOTE — Brief Op Note (Signed)
02/20/2021  9:46 AM  PATIENT:  [name]  50 y.o. female  PRE-OPERATIVE DIAGNOSIS:  panniculitis  POST-OPERATIVE DIAGNOSIS:  panniculitis  PROCEDURE:  Procedure(s): Fleur-di-lis panniculectomy (N/A)  SURGEON:  Surgeon(s) and Role:    * Evander Macaraeg, Wendy Poet, MD - Primary  PHYSICIAN ASSISTANT: Materials engineer, PA  ASSISTANTS: none   ANESTHESIA:   general  EBL:  100   BLOOD ADMINISTERED:none  DRAINS: (2) Jackson-Pratt drain(s) with closed bulb suction in the abdomen   LOCAL MEDICATIONS USED:  MARCAINE     SPECIMEN:  Source of Specimen:  abdominal pannus  DISPOSITION OF SPECIMEN:  PATHOLOGY  COUNTS:  YES  TOURNIQUET:  * No tourniquets in log *  DICTATION: .Dragon Dictation  PLAN OF CARE: Discharge to home after PACU  PATIENT DISPOSITION:  PACU - hemodynamically stable.   Delay start of Pharmacological VTE agent (>24hrs) due to surgical blood loss or risk of bleeding: not applicable

## 2021-02-21 ENCOUNTER — Telehealth: Payer: Self-pay

## 2021-02-21 ENCOUNTER — Encounter (HOSPITAL_COMMUNITY): Payer: Self-pay | Admitting: Plastic Surgery

## 2021-02-21 LAB — SURGICAL PATHOLOGY

## 2021-02-21 NOTE — Telephone Encounter (Signed)
Patient called to state her right-sided drain output is very little compared to the left, and would like to know if this is normal.

## 2021-02-21 NOTE — Telephone Encounter (Addendum)
Called and spoke with the patient regarding the message below.  Informed the patient that I spoke with [facility] and he stated that it's normal for one drain to put out more than the other.    Patient verbalized understanding and agreed.  She stated that the left drain is now starting to drain more.    Patient stated that she did not get an antibiotic for after surgery.  I informed the patient that she received an antibiotic during surgery, so she will not need an antibiotic for after surgery.  Patient verbalized understanding and agreed.    Patient also asked about taking a shower in 3 days.  She stated that she was kind of nervous about taking the binder off.    She stated that the gauze and pads were coming out of the binder.    Informed the patient that it's okay if the gauze and abd pads fall off.  Just don't take off any tape that is stuck to her skin.    Informed her that she can get in the shower and let the water hit her back and run down over her shoulders.   But don't let the water hit directly on her abdomen.    Also informed her if she don't want to get in the shower she can clean with baby wipes.  Patient verbalized understanding and agreed.//AB/CMA

## 2021-02-24 ENCOUNTER — Telehealth: Payer: Self-pay

## 2021-02-24 NOTE — Telephone Encounter (Signed)
Patient called to let us know she can feel a large knot at her pubic area that is sore to touch. She denies any fever or chills.

## 2021-02-24 NOTE — Telephone Encounter (Signed)
Pt. Trying to call her plastic surgeon. Called [facility].Pt. given correct phone number.

## 2021-02-24 NOTE — Telephone Encounter (Signed)
Spoke back with the patient and she was given an appointment on (Monday-02/27/21 @ 3:15pm) with Dr. Arita Miss.  Informed the patient if she gets worse over the weekend please give [place] a call back.//ABCMA

## 2021-02-27 ENCOUNTER — Encounter: Payer: Self-pay | Admitting: Plastic Surgery

## 2021-02-27 ENCOUNTER — Other Ambulatory Visit: Payer: Self-pay

## 2021-02-27 ENCOUNTER — Ambulatory Visit (INDEPENDENT_AMBULATORY_CARE_PROVIDER_SITE_OTHER): Payer: Medicare Other | Admitting: Plastic Surgery

## 2021-02-27 VITALS — BP 130/77 | HR 95

## 2021-02-27 DIAGNOSIS — M793 Panniculitis, unspecified: Secondary | ICD-10-CM

## 2021-02-27 NOTE — Progress Notes (Signed)
Patient presents 1 week postop from fleur-de-lis panniculectomy.  She feels like things are coming along and has no specific complaints other than the general soreness which she expected.  On exam everything looks to be healing okay.  There is some mild redness along the incisions throughout but this is within normal limits for this point in the process.  She has had very little output from both drains so I removed to the right-sided drain today.  All the incisions are intact with no obvious subcutaneous fluid.  We will plan to continue compressive garments and avoid strenuous activity and I will see her again next week to hopefully remove the second drain.  All of her questions were answered.

## 2021-03-02 ENCOUNTER — Encounter: Payer: Medicare Other | Admitting: Plastic Surgery

## 2021-03-06 ENCOUNTER — Telehealth: Payer: Self-pay | Admitting: Plastic Surgery

## 2021-03-06 NOTE — Telephone Encounter (Signed)
Patient called and lvm on Friday, but office was closed, to request antibiotic. She believes the spot near her groin area is infected. Please call patient to advise.

## 2021-03-06 NOTE — Telephone Encounter (Signed)
Returned patients call. She spoke with someone over the weekend,  ABX were called in on Saturday. She still has a knot in the groin area. Denies any fever, chills, nausea, nor vomiting. Reminded her follow up appointment Friday, 03/10/2021 at 11:20 with [facility]. She was under the impression her next appointment was this Thursday. Since she came in on the 02/27/21, 03/03/2021 (Thurs was cancelled). Will check with the front desk to see if they can change appointment.

## 2021-03-08 ENCOUNTER — Other Ambulatory Visit: Payer: Self-pay

## 2021-03-08 ENCOUNTER — Ambulatory Visit (INDEPENDENT_AMBULATORY_CARE_PROVIDER_SITE_OTHER): Payer: Medicare Other | Admitting: Surgical

## 2021-03-08 ENCOUNTER — Encounter: Payer: Self-pay | Admitting: Surgical

## 2021-03-08 VITALS — BP 136/101 | HR 89

## 2021-03-08 DIAGNOSIS — M793 Panniculitis, unspecified: Secondary | ICD-10-CM

## 2021-03-08 NOTE — Progress Notes (Signed)
Patient is a 50 year old female here for follow-up after fleur-de-lis panniculectomy with Dr. Arita Miss on 02/20/2021.  She is just over 2 weeks postop.  Patient reports she is currently on doxycycline which was prescribed to her over the weekend.  She reports that she most recently went to an outside hospital and had a CT scan of her abdomen because she was concerned about some drainage that she had from her incision.  Unfortunately these results are on a disc which I do not have access to.  She does not have a report either.  We have requested the records.  Based on her description it sounds as if she had a small seroma which drained.  She reports that she is not having any infectious symptoms.  She is having normal bowel movements and is urinating normally.  She denies any fevers, chills, nausea, vomiting, chest pain, shortness of breath.  She reports that her left JP drain has been putting out 20 cc or less per 24 hours for the past few days.  Chaperone present on exam On exam her incision is intact vertically and horizontally.  She does have some mild erythema of the midline incision, however this is very tight and appears more irritated than infected at this point.  There is no drainage noted on exam.  No subcutaneous fluid collections noted with palpation.  Minimal tenderness with palpation of the entire abdomen.  Her left JP drain is in place with some serosanguineous drainage in the bulb.  Umbilicus incision is intact, no necrosis noted.  No erythema is noted.  No drainage noted around the umbilicus.  I recommend she follow-up in 1 to 2 weeks for reevaluation.  I recommend she finish the doxycycline.  I recommend she continue her compressive garments.  I recommend she continue to sleep in a reclined position.  Avoid any strenuous activities or heavy lifting.  Patient is understanding of this.  I discussed with her that on exam today there is no sign of infection.  I did take a picture and place it in  the chart with patient's permission.  All of her questions were answered to her content.

## 2021-03-09 ENCOUNTER — Other Ambulatory Visit: Payer: Self-pay | Admitting: Surgical

## 2021-03-09 ENCOUNTER — Telehealth: Payer: Self-pay

## 2021-03-09 NOTE — Telephone Encounter (Signed)
Matt returned patients call. 

## 2021-03-09 NOTE — Telephone Encounter (Signed)
Patient called to say that last night and this morning she is experiencing pain in her groin to the left side of the knot when she moves.  Patient said that she is in tears when she tries to pick her legs up to put them on the bed.  Patient would like to know if this is normal.  Please call.

## 2021-03-10 ENCOUNTER — Encounter: Payer: Medicare Other | Admitting: Surgical

## 2021-03-15 ENCOUNTER — Telehealth: Payer: Self-pay | Admitting: Plastic Surgery

## 2021-03-15 ENCOUNTER — Encounter: Payer: Self-pay | Admitting: Plastic Surgery

## 2021-03-15 NOTE — Telephone Encounter (Signed)
At the request of the on call provider, patient sent picture via mychart to determine if she needs to be seen in the office for the opening. Please call patient to advise.

## 2021-03-17 NOTE — Telephone Encounter (Signed)
Matt sent message to patient via Mychart.

## 2021-03-20 ENCOUNTER — Ambulatory Visit (INDEPENDENT_AMBULATORY_CARE_PROVIDER_SITE_OTHER): Payer: Medicare Other | Admitting: Plastic Surgery

## 2021-03-20 ENCOUNTER — Other Ambulatory Visit: Payer: Self-pay

## 2021-03-20 DIAGNOSIS — M793 Panniculitis, unspecified: Secondary | ICD-10-CM

## 2021-03-20 NOTE — Progress Notes (Signed)
Patient is here about a month postop from fleur-de-lis panniculectomy.  She feels like things are going well overall.  She does note a small opening inferiorly at the T-junction but otherwise feels like things are healing well.  On exam all of her incisions are intact with the exception of about 2 cm superficially at the T-junction.  There is granulation tissue at the base and this looks to be doing well.  She said the absorbable staple was removed from that area last night and has since felt a lot better.  There was a few other spitting stitches which I removed and otherwise looks like she has a good result.  We will plan on doing wound care to that small open wound and I will see her again in around 4 weeks.  All of her questions were answered.

## 2021-03-26 ENCOUNTER — Encounter: Payer: Self-pay | Admitting: Plastic Surgery

## 2021-04-18 ENCOUNTER — Ambulatory Visit: Payer: Medicare Other | Admitting: Surgical

## 2021-04-18 NOTE — Progress Notes (Deleted)
The patient is a 50 year old female here for follow-up after fleur-de-lis panniculectomy with Dr. Arita Miss on 02/20/2021.  She is 8 weeks postop.

## 2021-04-21 ENCOUNTER — Ambulatory Visit: Payer: Medicare Other | Admitting: Surgical

## 2021-04-24 NOTE — Progress Notes (Signed)
50 year old female status post fleur-de-lis panniculectomy on 02/20/2021 with Dr. Arita Miss.  She is 2 months postop.  She reports overall she is doing well.  She has some questions about when she can swim.  She reports she has been using some liquid Band-Aid on the edges of her abdominal wound.  She is bothered by an area on the left side of her abdomen that is fuller.  Chaperone present on exam On exam she has midline abdominal wound that overall appears to be healing well.  There is no surrounding erythema or cellulitic changes.  All other abdominal incisions are intact and healing very nicely.  She has a little bit of fat necrosis just superior to the umbilicus.  Umbilicus incision is intact and well-healed.  No skin necrosis noted.  She has some excess adipose tissue along the left side of her abdomen.  This is evident in her preoperative photos.   Recommend Vaseline and gauze to abdominal wound daily.  Recommend avoiding submerging the abdominal wound in a pool.  Recommend avoiding this until this has completely healed.  Recommend following up in 2 to 3 weeks via virtual visit.  I discussed with the patient that if at any time the wound heals prior to this she is welcome to send me a MyChart message with the photo and I would be happy to evaluate it.  There is no sign of infection, seroma, hematoma.  Recommend avoiding using liquid Band-Aid.  We reviewed EMR preoperative photos and placed an additional photo in patient's chart with patient permission.

## 2021-04-25 ENCOUNTER — Other Ambulatory Visit: Payer: Self-pay

## 2021-04-25 ENCOUNTER — Ambulatory Visit (INDEPENDENT_AMBULATORY_CARE_PROVIDER_SITE_OTHER): Payer: Medicare Other | Admitting: Surgical

## 2021-04-25 DIAGNOSIS — M793 Panniculitis, unspecified: Secondary | ICD-10-CM

## 2021-04-26 ENCOUNTER — Ambulatory Visit: Payer: Medicare Other | Admitting: Surgical

## 2021-05-03 ENCOUNTER — Telehealth: Payer: Self-pay

## 2021-05-03 NOTE — Telephone Encounter (Signed)
Returned patients call. A couple of days ago, she noticed a new bump measuring approximate 1/2 inch inside lateral of the right umbilicus. It is sore to touch. Denies fever, chills, nausea, vomiting, redness, drainage, nor odor. She has been continuously applying Vaseline only, and staying out of the pool. Per [place], could be fat necrosis and to gently rub several times a day. Should anything change or worsen, call our office. Patient understood and agreed with plan.

## 2021-05-16 ENCOUNTER — Telehealth (INDEPENDENT_AMBULATORY_CARE_PROVIDER_SITE_OTHER): Payer: Medicare Other | Admitting: Surgical

## 2021-05-16 ENCOUNTER — Other Ambulatory Visit: Payer: Self-pay

## 2021-05-16 DIAGNOSIS — M793 Panniculitis, unspecified: Secondary | ICD-10-CM

## 2021-05-16 NOTE — Progress Notes (Signed)
Patient is a 50 year old female here for follow-up after fleur-de-lis panniculectomy with Dr. Arita Miss on 02/20/2021.  She reports that overall she is doing much better.  She reports that the wound she had at the T-junction is improving and the area above her bellybutton is improving as well.  She is concerned about a little bit of excess tissue that she notices along the left side of her abdomen.  She reports that she feels as if it is noticeable in her close and this bothers her.  Chaperone present on exam On exam abdominal incisions are intact and well-healed.  Umbilicus is viable.  No abdominal erythema noted.  No tenderness to palpation noted.  No erythema noted.  No foul odors are noted.  No open wounds are noted.  She does have some excess adipose tissue along the left side of her abdomen.  This was present preoperatively and is visible in preoperative photos.  I discussed with Ms. Vanvorst that everything appears to be healing well.  I do not see any signs of concern on exam. She has no restrictions at this point. Picture was taken and placed in the patient's chart with patient's permission.  I did discuss with her that the excess adipose tissue along the left side of her abdomen could be treated with liposuction.  All of her questions were answered to her content.  I recommend she follow-up on an as-needed basis.  I recommend she call with questions or concerns.  There is no sign of infection, seroma, hematoma.

## 2021-06-05 ENCOUNTER — Telehealth: Payer: Self-pay | Admitting: Plastic Surgery

## 2021-06-05 NOTE — Telephone Encounter (Signed)
Patient reports growing protrusion of left side of stomach. Patient does not report tenderness or discoloration. Sx 4/4. Please call to advise [phone]. Thanks

## 2021-06-05 NOTE — Telephone Encounter (Signed)
Called and [facility] @ 2:44pm) regarding message below.//AB/CMA

## 2021-06-05 NOTE — Telephone Encounter (Signed)
Patient called back and was informed that she will need an appointment.  Appointment made.//AB/CMA

## 2021-06-06 ENCOUNTER — Ambulatory Visit (INDEPENDENT_AMBULATORY_CARE_PROVIDER_SITE_OTHER): Payer: Medicare Other | Admitting: Surgical

## 2021-06-06 ENCOUNTER — Encounter: Payer: Self-pay | Admitting: Surgical

## 2021-06-06 ENCOUNTER — Other Ambulatory Visit: Payer: Self-pay

## 2021-06-06 DIAGNOSIS — M793 Panniculitis, unspecified: Secondary | ICD-10-CM

## 2021-06-06 NOTE — Progress Notes (Signed)
   Referring Provider Courtney Paris, NP [address] [place],  [place] [address]   CC:  Chief Complaint  Patient presents with   Follow-up      [name] is an 50 y.o. female.  HPI: Patient is a 50 year old female here for follow-up after fleur-de-lis panniculectomy with Dr. Arita Miss on 02/20/2021.  She is here to discuss concerns about unevenness of her abdomen.  She feels as if the left side of her abdomen is protruding more and she is bothered by this.  She reports that it is noticeable in her clothes and when she is wearing a bathing suit.  She has previously inquired about liposuction of this area, however with liposuction this may worsen due to less support for the tissue.   Review of Systems Skin: No rashes, no erythema  Physical Exam Vitals with BMI 03/08/2021 02/27/2021 02/20/2021  Height - - -  Weight - - -  BMI - - -  Systolic 136 130 607  Diastolic 101 77 84  Pulse 89 95 69  Some encounter information is confidential and restricted. Go to Review Flowsheets activity to see all data.    General:  No acute distress,  Alert and oriented, Non-Toxic, Normal speech and affect Pulmonary: Unlabored breathing  Assessment/Plan Patient is a 50 year old female who underwent fleur-de-lis panniculectomy with Dr. Arita Miss on 02/20/2021.  She had bariatric surgery in 2014 and lost about 200 pounds.  She is 3 and half months postop.  She is here today to discuss concerns for abdominal shape and excess tissue along the left side of her abdomen.  We have previously discussed various options for treatment of this, I have also discussed this with Dr. Arita Miss.  Unfortunately liposuction alone would improve the excess tissue, however there would be additional ptosis of the tissue given last structural support.  I did discuss this with the patient today.  I discussed with her that this is not something that insurance would cover given it is more of a cosmetic concern than a functional concern.  We did  review preoperative and postoperative photos today.  Patient is interested in a second opinion and would like an appointment with Dr. Arita Miss to discuss as well.  I discussed with her today that I would consult with Dr. Arita Miss in regards to scheduling a telephone or in person visit to further discuss.  All of the patient's questions were answered to her content.  A total of 22 minutes was spent on today's encounter which included reviewing patient's chart prior to appointment, documenting patient's encounter in EMR, evaluating the patient and discussing the plan with the patient.   Kermit Balo Regnald Bowens 06/06/2021, 9:06 AM

## 2021-06-06 NOTE — Telephone Encounter (Signed)
Patient has an appointment with [facility] today.

## 2021-07-26 ENCOUNTER — Other Ambulatory Visit: Payer: Self-pay

## 2021-07-26 ENCOUNTER — Ambulatory Visit (INDEPENDENT_AMBULATORY_CARE_PROVIDER_SITE_OTHER): Payer: Medicare Other | Admitting: Plastic Surgery

## 2021-07-26 DIAGNOSIS — M793 Panniculitis, unspecified: Secondary | ICD-10-CM | POA: Diagnosis not present

## 2021-07-26 NOTE — Progress Notes (Signed)
Patient presents 5 months postop from fleur-de-lis panniculectomy.  She feels like things have mostly healed up but is bothered by the contour of her abdomen.  She feels that the left side is more prominent than the right side.  On exam all of her incisions of healed.  Her scar looks to be doing fine.  She does have excess tissue on the left side which is more prominent than the right side.  This is in the mid abdomen around the level of the umbilicus.  Had a long discussion with the patient about how to move forward.  We reviewed her preoperative pictures.  Due to the size of her pannus initially she has what I would consider a satisfactory result.  The weight of the pannus was over 5000 g.  I did not feel that liposuction would adequately address this as she would still have some skin laxity after that.  We could entertain the option of a revision panniculectomy but she would rather not move forward forward with any additional surgeries at this time.  She does have a lingering knee issue that she is trying to avoid surgery for at the moment as well.  Ultimately I do not feel that additional surgery would be good for her at this time.  I am uncertain if it would truly address the contour as there is still quite a bit of skin redundancy and adipose tissue in those areas.  She is understanding and we will plan to see her again on an as-needed basis.

## 2021-08-04 NOTE — Addendum Note (Signed)
Addended by: Sherol Dade on: 08/04/2021 05:02 PM   Modules accepted: Orders

## 2021-08-21 ENCOUNTER — Encounter: Payer: Self-pay | Admitting: Plastic Surgery

## 2021-08-22 ENCOUNTER — Telehealth: Payer: Self-pay | Admitting: Plastic Surgery

## 2021-08-22 NOTE — Telephone Encounter (Signed)
Patient is currently experiencing a "mustard color" leaking from site. Started last night causing discomfort. Sx 4/4. Patient is currently visiting ED. Please call to advise [phone]. Thank you.

## 2021-08-23 NOTE — Telephone Encounter (Signed)
Returned patients call. She went to [facility] to be treated for the opening wound on the surgery site of abdomen that opened 3 days ago along with having greenish/yellowish drainage. ED physician prescribed Keflex for 7 days. Denies any fever, chills, nausea, vomiting, nor odor. Offered patient to come in office to be observed. Since the hole is closing back already, she would like to finish ABX. Should anything change, or become worse, call our office. Patient agreed and understood with plan.

## 2022-06-18 ENCOUNTER — Other Ambulatory Visit (HOSPITAL_COMMUNITY): Payer: Self-pay

## 2022-06-18 MED ORDER — OXYCODONE HCL 15 MG PO TABS
ORAL_TABLET | ORAL | 0 refills | Status: DC
  Filled 2022-06-18: qty 120, 30d supply, fill #0

## 2022-06-18 MED ORDER — ALPRAZOLAM 0.5 MG PO TABS
ORAL_TABLET | ORAL | 1 refills | Status: AC
  Filled 2022-06-18: qty 90, 30d supply, fill #0

## 2022-06-18 MED ORDER — TIZANIDINE HCL 6 MG PO CAPS
ORAL_CAPSULE | ORAL | 0 refills | Status: DC
  Filled 2022-06-18: qty 90, 30d supply, fill #0

## 2022-06-18 MED ORDER — GABAPENTIN 400 MG PO CAPS
ORAL_CAPSULE | ORAL | 0 refills | Status: AC
  Filled 2022-06-18: qty 120, 30d supply, fill #0

## 2022-06-19 ENCOUNTER — Other Ambulatory Visit (HOSPITAL_COMMUNITY): Payer: Self-pay

## 2022-07-17 ENCOUNTER — Other Ambulatory Visit (HOSPITAL_COMMUNITY): Payer: Self-pay

## 2022-08-13 ENCOUNTER — Other Ambulatory Visit (HOSPITAL_COMMUNITY): Payer: Self-pay

## 2022-08-14 ENCOUNTER — Other Ambulatory Visit (HOSPITAL_COMMUNITY): Payer: Self-pay

## 2022-08-14 MED ORDER — OXYCODONE HCL 15 MG PO TABS
15.0000 mg | ORAL_TABLET | Freq: Four times a day (QID) | ORAL | 0 refills | Status: DC | PRN
  Filled 2022-08-14: qty 120, 30d supply, fill #0

## 2022-09-03 ENCOUNTER — Other Ambulatory Visit (HOSPITAL_COMMUNITY): Payer: Self-pay

## 2022-09-04 ENCOUNTER — Other Ambulatory Visit (HOSPITAL_COMMUNITY): Payer: Self-pay

## 2022-09-04 MED ORDER — SAXENDA 18 MG/3ML ~~LOC~~ SOPN
PEN_INJECTOR | SUBCUTANEOUS | 0 refills | Status: DC
  Filled 2022-09-04: qty 3, 17d supply, fill #0

## 2022-09-05 ENCOUNTER — Other Ambulatory Visit (HOSPITAL_COMMUNITY): Payer: Self-pay

## 2022-09-06 ENCOUNTER — Other Ambulatory Visit (HOSPITAL_COMMUNITY): Payer: Self-pay

## 2022-09-08 ENCOUNTER — Other Ambulatory Visit (HOSPITAL_COMMUNITY): Payer: Self-pay

## 2022-09-10 ENCOUNTER — Other Ambulatory Visit (HOSPITAL_COMMUNITY): Payer: Self-pay

## 2022-09-12 ENCOUNTER — Other Ambulatory Visit (HOSPITAL_COMMUNITY): Payer: Self-pay

## 2022-09-28 ENCOUNTER — Other Ambulatory Visit (HOSPITAL_COMMUNITY): Payer: Self-pay

## 2022-10-10 ENCOUNTER — Other Ambulatory Visit (HOSPITAL_COMMUNITY): Payer: Self-pay

## 2022-10-12 ENCOUNTER — Other Ambulatory Visit (HOSPITAL_COMMUNITY): Payer: Self-pay

## 2022-11-22 ENCOUNTER — Ambulatory Visit: Payer: Medicare Other | Admitting: Podiatry

## 2022-12-14 ENCOUNTER — Ambulatory Visit: Payer: Self-pay | Admitting: Student

## 2022-12-14 NOTE — Progress Notes (Signed)
Sent message, via epic in basket, requesting orders in epic from surgeon.  

## 2022-12-16 ENCOUNTER — Ambulatory Visit: Payer: Self-pay | Admitting: Student

## 2022-12-18 ENCOUNTER — Ambulatory Visit: Payer: Self-pay | Admitting: Student

## 2022-12-18 NOTE — H&P (View-Only) (Signed)
TOTAL KNEE ADMISSION H&P  Patient is being admitted for right total knee arthroplasty.  Subjective:  Chief Complaint:right knee pain.  HPI: [name], 52 y.o. female, has a history of pain and functional disability in the right knee due to arthritis and has failed non-surgical conservative treatments for greater than 12 weeks to includeNSAID's and/or analgesics, corticosteriod injections, flexibility and strengthening excercises, use of assistive devices, weight reduction as appropriate, and activity modification.  Onset of symptoms was gradual, starting 10 years ago with rapidlly worsening course since that time. The patient noted no past surgery on the right knee(s).  Patient currently rates pain in the right knee(s) at 10 out of 10 with activity. Patient has night pain, worsening of pain with activity and weight bearing, pain that interferes with activities of daily living, pain with passive range of motion, crepitus, and joint swelling.  Patient has evidence of subchondral cysts, subchondral sclerosis, periarticular osteophytes, and joint space narrowing by imaging studies. There is no active infection.  Patient Active Problem List   Diagnosis Date Noted   Obesity 07/09/2012   GERD (gastroesophageal reflux disease) 07/09/2012   HTN (hypertension) 07/09/2012   Sleep apnea 07/09/2012   Past Medical History:  Diagnosis Date   Anxiety    Arthritis    Asthma    Diverticulitis of sigmoid colon    GERD (gastroesophageal reflux disease)    Hiatal hernia    Per pt on 06/04/12   Hypertension    no meds in one year   Hypothyroidism    Morbid obesity ([place])    Sleep apnea    no cpap used    Past Surgical History:  Procedure Laterality Date   ABDOMINAL HYSTERECTOMY  12 yrs ago   BREATH TEK H PYLORI  06/25/2012   Procedure: BREATH TEK H PYLORI;  Surgeon: Edward Jolly, MD;  Location: [facility];  Service: General;  Laterality: N/A;  Dr. Dyane Dustman patient   CALDWELL LUC Right  12/21/2016   CARPAL TUNNEL RELEASE Left 11/06/2018   COLONOSCOPY     ESOPHAGOGASTRODUODENOSCOPY  12/23/2012   Procedure: ESOPHAGOGASTRODUODENOSCOPY (EGD);  Surgeon: Madilyn Hook, DO;  Location: [facility];  Service: General;  Laterality: N/A;   HERNIA REPAIR     INNER EAR SURGERY     KNEE ARTHROSCOPY     left   LAPAROSCOPIC GASTRIC SLEEVE RESECTION  12/23/2012   Procedure: LAPAROSCOPIC GASTRIC SLEEVE RESECTION;  Surgeon: Madilyn Hook, DO;  Location: [facility];  Service: General;  Laterality: N/A;   LAPAROSCOPIC SIGMOID COLECTOMY  07/02/2018   NASAL SINUS SURGERY     with stents- 2017   PANNICULECTOMY N/A 02/20/2021   Procedure: Fleur-di-lis panniculectomy;  Surgeon: Cindra Presume, MD;  Location: [facility];  Service: Plastics;  Laterality: N/A;   TONSILLECTOMY AND ADENOIDECTOMY      Current Outpatient Medications  Medication Sig Dispense Refill Last Dose   albuterol (VENTOLIN HFA) 108 (90 Base) MCG/ACT inhaler Inhale 1-2 puffs into the lungs every 6 (six) hours as needed for wheezing or shortness of breath.      ALPRAZolam (XANAX) 0.5 MG tablet Take 1 tablet by outh 3 times daily, as needed for anxiety 90 tablet 1    atorvastatin (LIPITOR) 40 MG tablet Take 40 mg by mouth at bedtime.      estrogen, conjugated,-medroxyprogesterone (PREMPRO) 0.3-1.5 MG tablet Take 1 tablet by mouth daily.      etodolac (LODINE XL) 600 MG 24 hr tablet Take 600 mg by mouth daily.  famotidine (PEPCID) 40 MG tablet Take 40 mg by mouth daily.      gabapentin (NEURONTIN) 400 MG capsule Take 1 capsule by mouth 4 times daily for neuropathy (Patient taking differently: Take 400 mg by mouth daily as needed (pain).) 120 capsule 0    levothyroxine (SYNTHROID) 100 MCG tablet Take 100 mcg by mouth daily before breakfast.      LINZESS 290 MCG CAPS capsule Take 290 mcg by mouth 3 (three) times daily as needed (Constipation).      Liraglutide -Weight Management (SAXENDA) 18 MG/3ML SOPN Inject into the skin as directed, 0.72m  daily for 7 days, 1.2 mg daily for 7 days, 1.857mdaily for 7 day, then 2.51m36maily. (Patient not taking: Reported on 12/20/2022) 3 mL 0    omeprazole (PRILOSEC) 40 MG capsule Take 40 mg by mouth daily.      ondansetron (ZOFRAN-ODT) 4 MG disintegrating tablet Take 4 mg by mouth every 8 (eight) hours as needed for nausea or vomiting.      oxyCODONE (ROXICODONE) 15 MG immediate release tablet Take 1 tablet (15 mg total) by mouth every 6 (six) hours as needed for pain. (Patient taking differently: Take 15 mg by mouth 4 (four) times daily.) 120 tablet 0    RELISTOR 150 MG TABS Take 150 mg by mouth daily.      Semaglutide, 1 MG/DOSE, (OZEMPIC, 1 MG/DOSE,) 4 MG/3ML SOPN Inject 1 mg into the skin once a week.      tizanidine (ZANAFLEX) 6 MG capsule Take 1 capsule by mouth 3 times daily as needed for muscle spasms 90 capsule 0    valACYclovir (VALTREX) 1000 MG tablet Take 1 tablet by mouth daily as needed (outbreak).      Vitamin D, Ergocalciferol, (DRISDOL) 1.25 MG (50000 UNIT) CAPS capsule Take 50,000 Units by mouth once a week.      Current Facility-Administered Medications  Medication Dose Route Frequency Provider Last Rate Last Admin   triamcinolone acetonide (KENALOG) 10 MG/ML injection 10 mg  10 mg Other Once SikHarriet MassonPM       Allergies  Allergen Reactions   Lorazepam Anaphylaxis   Nitrofurantoin Nausea And Vomiting and Other (See Comments)   Other Other (See Comments)    Trees and pollen    Social History   Tobacco Use   Smoking status: Former    Years: 5.00    Types: Cigarettes    Quit date: 07/15/2013    Years since quitting: 9.4   Smokeless tobacco: Never  Substance Use Topics   Alcohol use: No    Family History  Problem Relation Age of Onset   Cancer Father        lymphoma   Cancer Paternal Grandmother        breast     Review of Systems  Musculoskeletal:  Positive for arthralgias, gait problem and joint swelling.  All other systems reviewed and are  negative.   Objective:  Physical Exam Constitutional:      Appearance: Normal appearance.  HENT:     Head: Normocephalic and atraumatic.     Nose: Nose normal.     Mouth/Throat:     Mouth: Mucous membranes are moist.     Pharynx: Oropharynx is clear.  Eyes:     Conjunctiva/sclera: Conjunctivae normal.  Cardiovascular:     Rate and Rhythm: Normal rate and regular rhythm.     Pulses: Normal pulses.     Heart sounds: Normal heart sounds.  Pulmonary:  Effort: Pulmonary effort is normal.     Breath sounds: Normal breath sounds.  Abdominal:     General: Abdomen is flat.     Palpations: Abdomen is soft.  Genitourinary:    Comments: deferred Musculoskeletal:     Cervical back: Normal range of motion and neck supple.     Comments: Examination of the right knee reveals no skin wounds or lesions. She has some swelling, trace effusion. No warmth or erythema. Tenderness to palpation medial joint line and peripatellar retinacular tissues with a positive grind sign. Valgus deformity. Range of motion 0 to 115 degrees without any ligamentous instability. Painless range of motion hip  Distally, there is no focal motor or sensory deficit. She is palpable pedal pulses.  She ambulates with an antalgic gait.  Skin:    General: Skin is warm and dry.     Capillary Refill: Capillary refill takes less than 2 seconds.  Neurological:     General: No focal deficit present.     Mental Status: She is alert and oriented to person, place, and time.  Psychiatric:        Mood and Affect: Mood normal.        Behavior: Behavior normal.        Thought Content: Thought content normal.        Judgment: Judgment normal.     Vital signs in last 24 hours: @VSRANGES$ @  Labs:   Estimated body mass index is 39.19 kg/m as calculated from the following:   Height as of 12/21/22: 5' 11"$  (1.803 m).   Weight as of 12/21/22: 127.5 kg.   Imaging Review Plain radiographs demonstrate severe degenerative joint  disease of the right knee(s). The overall alignment ismild valgus. The bone quality appears to be adequate for age and reported activity level.      Assessment/Plan:  End stage arthritis, right knee   The patient history, physical examination, clinical judgment of the provider and imaging studies are consistent with end stage degenerative joint disease of the right knee(s) and total knee arthroplasty is deemed medically necessary. The treatment options including medical management, injection therapy arthroscopy and arthroplasty were discussed at length. The risks and benefits of total knee arthroplasty were presented and reviewed. The risks due to aseptic loosening, infection, stiffness, patella tracking problems, thromboembolic complications and other imponderables were discussed. The patient acknowledged the explanation, agreed to proceed with the plan and consent was signed. Patient is being admitted for inpatient treatment for surgery, pain control, PT, OT, prophylactic antibiotics, VTE prophylaxis, progressive ambulation and ADL's and discharge planning. The patient is planning to be discharged home with OPPT.   Therapy Plans: outpatient therapy. [facility] [place] 01/07/23 printed copy given to patient.  Disposition: Home with son Planned DVT Prophylaxis: aspirin 35m BID DME needed: walker PCP: Cleared.  TXA: IV Allergies:  - Ativan - anaphylaxis.  - Nitrofurantoin - abdominal pain. Anesthesia Concerns: None.  BMI: 39.3 Last HgbA1c: 5.1. Not diabetic.  Other: - Chronic low back pain, oxycodone 12m4x/day. Pain management.  - ozempic, stop 1 week prior to surgery.  - Oxycodone, zofran, methocarbamol, has etodolac.  - 12/21/22: Hgb 14.8, Cr. 0.84.    Patient's anticipated LOS is less than 2 midnights, meeting these requirements: - Younger than 6529 Lives within 1 hour of care - Has a competent adult at home to recover with post-op recover - NO history  of  - Chronic pain requiring opiods  - Diabetes  -  Coronary Artery Disease  - Heart failure  - Heart attack  - Stroke  - DVT/VTE  - Cardiac arrhythmia  - Respiratory Failure/COPD  - Renal failure  - Anemia  - Advanced Liver disease

## 2022-12-18 NOTE — H&P (Signed)
TOTAL KNEE ADMISSION H&P  Patient is being admitted for right total knee arthroplasty.  Subjective:  Chief Complaint:right knee pain.  HPI: [name], 52 y.o. female, has a history of pain and functional disability in the right knee due to arthritis and has failed non-surgical conservative treatments for greater than 12 weeks to includeNSAID's and/or analgesics, corticosteriod injections, flexibility and strengthening excercises, use of assistive devices, weight reduction as appropriate, and activity modification.  Onset of symptoms was gradual, starting 10 years ago with rapidlly worsening course since that time. The patient noted no past surgery on the right knee(s).  Patient currently rates pain in the right knee(s) at 10 out of 10 with activity. Patient has night pain, worsening of pain with activity and weight bearing, pain that interferes with activities of daily living, pain with passive range of motion, crepitus, and joint swelling.  Patient has evidence of subchondral cysts, subchondral sclerosis, periarticular osteophytes, and joint space narrowing by imaging studies. There is no active infection.  Patient Active Problem List   Diagnosis Date Noted   Obesity 07/09/2012   GERD (gastroesophageal reflux disease) 07/09/2012   HTN (hypertension) 07/09/2012   Sleep apnea 07/09/2012   Past Medical History:  Diagnosis Date   Anxiety    Arthritis    Asthma    Diverticulitis of sigmoid colon    GERD (gastroesophageal reflux disease)    Hiatal hernia    Per pt on 06/04/12   Hypertension    no meds in one year   Hypothyroidism    Morbid obesity ([place])    Sleep apnea    no cpap used    Past Surgical History:  Procedure Laterality Date   ABDOMINAL HYSTERECTOMY  12 yrs ago   BREATH TEK H PYLORI  06/25/2012   Procedure: BREATH TEK H PYLORI;  Surgeon: Edward Jolly, MD;  Location: [facility];  Service: General;  Laterality: N/A;  Dr. Dyane Dustman patient   CALDWELL LUC Right  12/21/2016   CARPAL TUNNEL RELEASE Left 11/06/2018   COLONOSCOPY     ESOPHAGOGASTRODUODENOSCOPY  12/23/2012   Procedure: ESOPHAGOGASTRODUODENOSCOPY (EGD);  Surgeon: Madilyn Hook, DO;  Location: [facility];  Service: General;  Laterality: N/A;   HERNIA REPAIR     INNER EAR SURGERY     KNEE ARTHROSCOPY     left   LAPAROSCOPIC GASTRIC SLEEVE RESECTION  12/23/2012   Procedure: LAPAROSCOPIC GASTRIC SLEEVE RESECTION;  Surgeon: Madilyn Hook, DO;  Location: [facility];  Service: General;  Laterality: N/A;   LAPAROSCOPIC SIGMOID COLECTOMY  07/02/2018   NASAL SINUS SURGERY     with stents- 2017   PANNICULECTOMY N/A 02/20/2021   Procedure: [place] panniculectomy;  Surgeon: Cindra Presume, MD;  Location: [facility];  Service: Plastics;  Laterality: N/A;   TONSILLECTOMY AND ADENOIDECTOMY      Current Outpatient Medications  Medication Sig Dispense Refill Last Dose   albuterol (VENTOLIN HFA) 108 (90 Base) MCG/ACT inhaler Inhale 1-2 puffs into the lungs every 6 (six) hours as needed for wheezing or shortness of breath.      ALPRAZolam (XANAX) 0.5 MG tablet Take 1 tablet by outh 3 times daily, as needed for anxiety 90 tablet 1    atorvastatin (LIPITOR) 40 MG tablet Take 40 mg by mouth at bedtime.      estrogen, conjugated,-medroxyprogesterone (PREMPRO) 0.3-1.5 MG tablet Take 1 tablet by mouth daily.      etodolac (LODINE XL) 600 MG 24 hr tablet Take 600 mg by mouth daily.  famotidine (PEPCID) 40 MG tablet Take 40 mg by mouth daily.      gabapentin (NEURONTIN) 400 MG capsule Take 1 capsule by mouth 4 times daily for neuropathy (Patient taking differently: Take 400 mg by mouth daily as needed (pain).) 120 capsule 0    levothyroxine (SYNTHROID) 100 MCG tablet Take 100 mcg by mouth daily before breakfast.      LINZESS 290 MCG CAPS capsule Take 290 mcg by mouth 3 (three) times daily as needed (Constipation).      Liraglutide -Weight Management (SAXENDA) 18 MG/3ML SOPN Inject into the skin as directed, 0.72m  daily for 7 days, 1.2 mg daily for 7 days, 1.857mdaily for 7 day, then 2.51m36maily. (Patient not taking: Reported on 12/20/2022) 3 mL 0    omeprazole (PRILOSEC) 40 MG capsule Take 40 mg by mouth daily.      ondansetron (ZOFRAN-ODT) 4 MG disintegrating tablet Take 4 mg by mouth every 8 (eight) hours as needed for nausea or vomiting.      oxyCODONE (ROXICODONE) 15 MG immediate release tablet Take 1 tablet (15 mg total) by mouth every 6 (six) hours as needed for pain. (Patient taking differently: Take 15 mg by mouth 4 (four) times daily.) 120 tablet 0    RELISTOR 150 MG TABS Take 150 mg by mouth daily.      Semaglutide, 1 MG/DOSE, (OZEMPIC, 1 MG/DOSE,) 4 MG/3ML SOPN Inject 1 mg into the skin once a week.      tizanidine (ZANAFLEX) 6 MG capsule Take 1 capsule by mouth 3 times daily as needed for muscle spasms 90 capsule 0    valACYclovir (VALTREX) 1000 MG tablet Take 1 tablet by mouth daily as needed (outbreak).      Vitamin D, Ergocalciferol, (DRISDOL) 1.25 MG (50000 UNIT) CAPS capsule Take 50,000 Units by mouth once a week.      Current Facility-Administered Medications  Medication Dose Route Frequency Provider Last Rate Last Admin   triamcinolone acetonide (KENALOG) 10 MG/ML injection 10 mg  10 mg Other Once SikHarriet MassonPM       Allergies  Allergen Reactions   Lorazepam Anaphylaxis   Nitrofurantoin Nausea And Vomiting and Other (See Comments)   Other Other (See Comments)    Trees and pollen    Social History   Tobacco Use   Smoking status: Former    Years: 5.00    Types: Cigarettes    Quit date: 07/15/2013    Years since quitting: 9.4   Smokeless tobacco: Never  Substance Use Topics   Alcohol use: No    Family History  Problem Relation Age of Onset   Cancer Father        lymphoma   Cancer Paternal Grandmother        breast     Review of Systems  Musculoskeletal:  Positive for arthralgias, gait problem and joint swelling.  All other systems reviewed and are  negative.   Objective:  Physical Exam Constitutional:      Appearance: Normal appearance.  HENT:     Head: Normocephalic and atraumatic.     Nose: Nose normal.     Mouth/Throat:     Mouth: Mucous membranes are moist.     Pharynx: Oropharynx is clear.  Eyes:     Conjunctiva/sclera: Conjunctivae normal.  Cardiovascular:     Rate and Rhythm: Normal rate and regular rhythm.     Pulses: Normal pulses.     Heart sounds: Normal heart sounds.  Pulmonary:  Effort: Pulmonary effort is normal.     Breath sounds: Normal breath sounds.  Abdominal:     General: Abdomen is flat.     Palpations: Abdomen is soft.  Genitourinary:    Comments: deferred Musculoskeletal:     Cervical back: Normal range of motion and neck supple.     Comments: Examination of the right knee reveals no skin wounds or lesions. She has some swelling, trace effusion. No warmth or erythema. Tenderness to palpation medial joint line and peripatellar retinacular tissues with a positive grind sign. Valgus deformity. Range of motion 0 to 115 degrees without any ligamentous instability. Painless range of motion hip  Distally, there is no focal motor or sensory deficit. She is palpable pedal pulses.  She ambulates with an antalgic gait.  Skin:    General: Skin is warm and dry.     Capillary Refill: Capillary refill takes less than 2 seconds.  Neurological:     General: No focal deficit present.     Mental Status: She is alert and oriented to person, place, and time.  Psychiatric:        Mood and Affect: Mood normal.        Behavior: Behavior normal.        Thought Content: Thought content normal.        Judgment: Judgment normal.     Vital signs in last 24 hours: @VSRANGES$ @  Labs:   Estimated body mass index is 39.19 kg/m as calculated from the following:   Height as of 12/21/22: 5' 11"$  (1.803 m).   Weight as of 12/21/22: 127.5 kg.   Imaging Review Plain radiographs demonstrate severe degenerative joint  disease of the right knee(s). The overall alignment ismild valgus. The bone quality appears to be adequate for age and reported activity level.      Assessment/Plan:  End stage arthritis, right knee   The patient history, physical examination, clinical judgment of the provider and imaging studies are consistent with end stage degenerative joint disease of the right knee(s) and total knee arthroplasty is deemed medically necessary. The treatment options including medical management, injection therapy arthroscopy and arthroplasty were discussed at length. The risks and benefits of total knee arthroplasty were presented and reviewed. The risks due to aseptic loosening, infection, stiffness, patella tracking problems, thromboembolic complications and other imponderables were discussed. The patient acknowledged the explanation, agreed to proceed with the plan and consent was signed. Patient is being admitted for inpatient treatment for surgery, pain control, PT, OT, prophylactic antibiotics, VTE prophylaxis, progressive ambulation and ADL's and discharge planning. The patient is planning to be discharged home with OPPT.   Therapy Plans: outpatient therapy. [facility] [place] 01/07/23 printed copy given to patient.  Disposition: Home with son Planned DVT Prophylaxis: aspirin 35m BID DME needed: walker PCP: Cleared.  TXA: IV Allergies:  - Ativan - anaphylaxis.  - Nitrofurantoin - abdominal pain. Anesthesia Concerns: None.  BMI: 39.3 Last HgbA1c: 5.1. Not diabetic.  Other: - Chronic low back pain, oxycodone 12m4x/day. Pain management.  - ozempic, stop 1 week prior to surgery.  - Oxycodone, zofran, methocarbamol, has etodolac.  - 12/21/22: Hgb 14.8, Cr. 0.84.    Patient's anticipated LOS is less than 2 midnights, meeting these requirements: - Younger than 6529 Lives within 1 hour of care - Has a competent adult at home to recover with post-op recover - NO history  of  - Chronic pain requiring opiods  - Diabetes  -  Coronary Artery Disease  - Heart failure  - Heart attack  - Stroke  - DVT/VTE  - Cardiac arrhythmia  - Respiratory Failure/COPD  - Renal failure  - Anemia  - Advanced Liver disease

## 2022-12-18 NOTE — Progress Notes (Addendum)
Anesthesia Review:  PCP: Simona Huh , NP LOV 10/22/22 on chart  Clearance 10/22/22 on chart  Cardiologist : none  Chest x-ray : EKG  12/21/22  Echo : Stress test: Cardiac Cath :  Activity level: can do a flight of stairs without difficutly  Sleep Study/ CPAP : none  Fasting Blood Sugar :      / Checks Blood Sugar -- times a day:   Blood Thinner/ Instructions /Last Dose: ASA / Instructions/ Last Dose :   Takes Ozempic for weight loss - Last dose 12/18/22 per pt  NOT taking Saxenda.   Blood pressure at preop was in left upper arm- 156/133.  And in left lower arm was 144/110.  Blood pressure in right upper arm was 133/104.  PT reports she checks blood pressure regularly at home.  Denies any chest pain, shortness, of breath, dizziness or headache.  PT to see PCP again on 12/31/22.  Last blood pressure reading 10/22/22 at office was 130/74.  PT has copy  of vital signs with her to take to office.  PT aware to monitor blood pressure at home.  EKG done.  [place], [facility] aware of above.  No new orders given.

## 2022-12-18 NOTE — Patient Instructions (Signed)
SURGICAL WAITING ROOM VISITATION  Patients having surgery or a procedure may have no more than 2 support people in the waiting area - these visitors may rotate.    Children under the age of 32 must have an adult with them who is not the patient.  Due to an increase in RSV and influenza rates and associated hospitalizations, children ages 31 and under may not visit patients in [place].  If the patient needs to stay at the hospital during part of their recovery, the visitor guidelines for inpatient rooms apply. Pre-op nurse will coordinate an appropriate time for 1 support person to accompany patient in pre-op.  This support person may not rotate.    Please refer to the [facility] website for the visitor guidelines for Inpatients (after your surgery is over and you are in a regular room).       Your procedure is scheduled on:  01/03/23    Report to [facility] Main Entrance    Report to admitting at   [phone]   Call this number if you have problems the morning of surgery [phone]   Do not eat food :After Midnight.   After Midnight you may have the following liquids until _0430_____ AM  DAY OF SURGERY  Water Non-Citrus Juices (without pulp, NO RED-Apple, White grape, White cranberry) Black Coffee (NO MILK/CREAM OR CREAMERS, sugar ok)  Clear Tea (NO MILK/CREAM OR CREAMERS, sugar ok) regular and decaf                             Plain Jell-O (NO RED)                                           Fruit ices (not with fruit pulp, NO RED)                                     Popsicles (NO RED)                                                               Sports drinks like Gatorade (NO RED)           The day of surgery:  Drink ONE (1) Pre-Surgery Clear Ensure or G2 at  0430AM ( have completed by )  the morning of surgery. Drink in one sitting. Do not sip.  This drink was given to you during your hospital  pre-op appointment visit. Nothing else to drink after  completing the  Pre-Surgery Clear Ensure or G2.          If you have questions, please contact your surgeon's office.        Oral Hygiene is also important to reduce your risk of infection.                                    Remember - BRUSH YOUR TEETH THE MORNING OF SURGERY WITH YOUR REGULAR TOOTHPASTE  DENTURES WILL BE REMOVED PRIOR  TO SURGERY PLEASE DO NOT APPLY "Poly grip" OR ADHESIVES!!!   Do NOT smoke after Midnight   Take these medicines the morning of surgery with A SIP OF WATER:   DO NOT TAKE ANY ORAL DIABETIC MEDICATIONS DAY OF YOUR SURGERY  Bring CPAP mask and tubing day of surgery.                              You may not have any metal on your body including hair pins, jewelry, and body piercing             Do not wear make-up, lotions, powders, perfumes/cologne, or deodorant  Do not wear nail polish including gel and S&S, artificial/acrylic nails, or any other type of covering on natural nails including finger and toenails. If you have artificial nails, gel coating, etc. that needs to be removed by a nail salon please have this removed prior to surgery or surgery may need to be canceled/ delayed if the surgeon/ anesthesia feels like they are unable to be safely monitored.   Do not shave  48 hours prior to surgery.               Men may shave face and neck.   Do not bring valuables to the hospital. [place].   Contacts, glasses, dentures or bridgework may not be worn into surgery.   Bring small overnight bag day of surgery.   DO NOT [place]. PHARMACY WILL DISPENSE MEDICATIONS LISTED ON YOUR MEDICATION LIST TO YOU DURING YOUR ADMISSION [place]!    Patients discharged on the day of surgery will not be allowed to drive home.  Someone NEEDS to stay with you for the first 24 hours after anesthesia.   Special Instructions: Bring a copy of your healthcare power of attorney  and living will documents the day of surgery if you haven't scanned them before.              Please read over the following fact sheets you were given: IF [place] [phone]   If you received a COVID test during your pre-op visit  it is requested that you wear a mask when out in public, stay away from anyone that may not be feeling well and notify your surgeon if you develop symptoms. If you test positive for Covid or have been in contact with anyone that has tested positive in the last 10 days please notify you surgeon.     - Preparing for Surgery Before surgery, you can play an important role.  Because skin is not sterile, your skin needs to be as free of germs as possible.  You can reduce the number of germs on your skin by washing with CHG (chlorahexidine gluconate) soap before surgery.  CHG is an antiseptic cleaner which kills germs and bonds with the skin to continue killing germs even after washing. Please DO NOT use if you have an allergy to CHG or antibacterial soaps.  If your skin becomes reddened/irritated stop using the CHG and inform your nurse when you arrive at Short Stay. Do not shave (including legs and underarms) for at least 48 hours prior to the first CHG shower.  You may shave your face/neck. Please follow these instructions carefully:  1.  Shower with CHG Soap the night before surgery and the  morning of Surgery.  2.  If you choose to wash your hair, wash your hair first as usual with your  normal  shampoo.  3.  After you shampoo, rinse your hair and body thoroughly to remove the  shampoo.                           4.  Use CHG as you would any other liquid soap.  You can apply chg directly  to the skin and wash                       Gently with a scrungie or clean washcloth.  5.  Apply the CHG Soap to your body ONLY FROM THE NECK DOWN.   Do not use on face/ open                           Wound or open sores.  Avoid contact with eyes, ears mouth and genitals (private parts).                       Wash face,  Genitals (private parts) with your normal soap.             6.  Wash thoroughly, paying special attention to the area where your surgery  will be performed.  7.  Thoroughly rinse your body with warm water from the neck down.  8.  DO NOT shower/wash with your normal soap after using and rinsing off  the CHG Soap.                9.  Pat yourself dry with a clean towel.            10.  Wear clean pajamas.            11.  Place clean sheets on your bed the night of your first shower and do not  sleep with pets. Day of Surgery : Do not apply any lotions/deodorants the morning of surgery.  Please wear clean clothes to the hospital/surgery center.  FAILURE TO FOLLOW THESE INSTRUCTIONS MAY RESULT IN THE CANCELLATION OF YOUR SURGERY PATIENT SIGNATURE_________________________________  NURSE SIGNATURE__________________________________  ________________________________________________________________________[facility] - Preparing for Surgery Before surgery, you can play an important role.  Because skin is not sterile, your skin needs to be as free of germs as possible.  You can reduce the number of germs on your skin by washing with CHG (chlorahexidine gluconate) soap before surgery.  CHG is an antiseptic cleaner which kills germs and bonds with the skin to continue killing germs even after washing. Please DO NOT use if you have an allergy to CHG or antibacterial soaps.  If your skin becomes reddened/irritated stop using the CHG and inform your nurse when you arrive at Short Stay. Do not shave (including legs and underarms) for at least 48 hours prior to the first CHG shower.  You may shave your face/neck. Please follow these instructions carefully:  1.  Shower with CHG Soap the night before surgery and the  morning of Surgery.  2.  If you choose to wash your hair, wash your hair first as usual with your   normal  shampoo.  3.  After you shampoo, rinse your hair and body thoroughly to remove the  shampoo.  4.  Use CHG as you would any other liquid soap.  You can apply chg directly  to the skin and wash                       Gently with a scrungie or clean washcloth.  5.  Apply the CHG Soap to your body ONLY FROM THE NECK DOWN.   Do not use on face/ open                           Wound or open sores. Avoid contact with eyes, ears mouth and genitals (private parts).                       Wash face,  Genitals (private parts) with your normal soap.             6.  Wash thoroughly, paying special attention to the area where your surgery  will be performed.  7.  Thoroughly rinse your body with warm water from the neck down.  8.  DO NOT shower/wash with your normal soap after using and rinsing off  the CHG Soap.                9.  Pat yourself dry with a clean towel.            10.  Wear clean pajamas.            11.  Place clean sheets on your bed the night of your first shower and do not  sleep with pets. Day of Surgery : Do not apply any lotions/deodorants the morning of surgery.  Please wear clean clothes to the hospital/surgery center.  FAILURE TO FOLLOW THESE INSTRUCTIONS MAY RESULT IN THE CANCELLATION OF YOUR SURGERY PATIENT SIGNATURE_________________________________  NURSE SIGNATURE__________________________________  ________________________________________________________________________

## 2022-12-21 ENCOUNTER — Encounter (HOSPITAL_COMMUNITY)
Admission: RE | Admit: 2022-12-21 | Discharge: 2022-12-21 | Disposition: A | Discharge status: Home / Self Care | Payer: 59 | Source: Ambulatory Visit | Attending: Orthopedic Surgery | Admitting: Orthopedic Surgery

## 2022-12-21 ENCOUNTER — Other Ambulatory Visit: Payer: Self-pay

## 2022-12-21 ENCOUNTER — Encounter (HOSPITAL_COMMUNITY): Payer: Self-pay

## 2022-12-21 VITALS — BP 133/104 | HR 77 | Temp 97.7°F | Resp 16 | Ht 71.0 in | Wt 281.0 lb

## 2022-12-21 DIAGNOSIS — Z01818 Encounter for other preprocedural examination: Secondary | ICD-10-CM | POA: Insufficient documentation

## 2022-12-21 LAB — CBC
HCT: 46 % (ref 36.0–46.0)
Hemoglobin: 14.8 g/dL (ref 12.0–15.0)
MCH: 28.7 pg (ref 26.0–34.0)
MCHC: 32.2 g/dL (ref 30.0–36.0)
MCV: 89.1 fL (ref 80.0–100.0)
Platelets: 240 10*3/uL (ref 150–400)
RBC: 5.16 MIL/uL — ABNORMAL HIGH (ref 3.87–5.11)
RDW: 13.9 % (ref 11.5–15.5)
WBC: 6.5 10*3/uL (ref 4.0–10.5)
nRBC: 0 % (ref 0.0–0.2)

## 2022-12-21 LAB — BASIC METABOLIC PANEL
Anion gap: 10 (ref 5–15)
BUN: 10 mg/dL (ref 6–20)
CO2: 23 mmol/L (ref 22–32)
Calcium: 8.8 mg/dL — ABNORMAL LOW (ref 8.9–10.3)
Chloride: 105 mmol/L (ref 98–111)
Creatinine, Ser: 0.84 mg/dL (ref 0.44–1.00)
GFR, Estimated: >60 mL/min (ref >60)
Glucose, Bld: 89 mg/dL (ref 70–99)
Potassium: 4.4 mmol/L (ref 3.5–5.1)
Sodium: 138 mmol/L (ref 135–145)

## 2022-12-21 LAB — SURGICAL PCR SCREEN
MRSA, PCR: NEGATIVE
Staphylococcus aureus: NEGATIVE

## 2022-12-31 ENCOUNTER — Other Ambulatory Visit: Payer: Self-pay

## 2022-12-31 ENCOUNTER — Other Ambulatory Visit (HOSPITAL_COMMUNITY): Payer: Self-pay

## 2022-12-31 MED ORDER — OXYCODONE HCL 15 MG PO TABS
15.0000 mg | ORAL_TABLET | Freq: Four times a day (QID) | ORAL | 0 refills | Status: DC | PRN
  Filled 2022-12-31: qty 120, 30d supply, fill #0

## 2023-01-02 NOTE — Anesthesia Preprocedure Evaluation (Signed)
Anesthesia Evaluation  Patient identified by MRN, date of birth, ID band Patient awake    Reviewed: Allergy & Precautions, NPO status , Patient's Chart, lab work & pertinent test results  History of Anesthesia Complications Negative for: history of anesthetic complications  Airway Mallampati: II  TM Distance: >3 FB Neck ROM: Full    Dental  (+) Edentulous Upper, Edentulous Lower   Pulmonary asthma , sleep apnea (no longer using CPAP) , former smoker   Pulmonary exam normal        Cardiovascular hypertension, Normal cardiovascular exam     Neuro/Psych  PSYCHIATRIC DISORDERS Anxiety     negative neurological ROS     GI/Hepatic Neg liver ROS, hiatal hernia,GERD  Medicated and Controlled,, S/p gastric sleeve    Endo/Other  Hypothyroidism   Obesity   Renal/GU negative Renal ROS     Musculoskeletal  (+) Arthritis ,    Abdominal   Peds  Hematology negative hematology ROS (+)   Anesthesia Other Findings On GLP-1a   Reproductive/Obstetrics  s/p hysterectomy                              Anesthesia Physical Anesthesia Plan  ASA: 3  Anesthesia Plan: Spinal   Post-op Pain Management: Regional block* and Tylenol PO (pre-op)*   Induction:   PONV Risk Score and Plan: 2 and Treatment may vary due to age or medical condition and Propofol infusion  Airway Management Planned: Natural Airway and Simple Face Mask  Additional Equipment: None  Intra-op Plan:   Post-operative Plan:   Informed Consent: I have reviewed the patients History and Physical, chart, labs and discussed the procedure including the risks, benefits and alternatives for the proposed anesthesia with the patient or authorized representative who has indicated his/her understanding and acceptance.       Plan Discussed with: CRNA and Anesthesiologist  Anesthesia Plan Comments:        Anesthesia Quick  Evaluation

## 2023-01-03 ENCOUNTER — Encounter (HOSPITAL_COMMUNITY)
Admission: RE | Disposition: A | Discharge status: Home / Self Care | Payer: Self-pay | Source: Ambulatory Visit | Attending: Orthopedic Surgery

## 2023-01-03 ENCOUNTER — Ambulatory Visit (HOSPITAL_COMMUNITY): Payer: 59 | Admitting: Physician Assistant

## 2023-01-03 ENCOUNTER — Ambulatory Visit (HOSPITAL_BASED_OUTPATIENT_CLINIC_OR_DEPARTMENT_OTHER): Payer: 59 | Admitting: Anesthesiology

## 2023-01-03 ENCOUNTER — Encounter (HOSPITAL_COMMUNITY): Payer: Self-pay | Admitting: Orthopedic Surgery

## 2023-01-03 ENCOUNTER — Ambulatory Visit (HOSPITAL_COMMUNITY)
Admission: RE | Admit: 2023-01-03 | Discharge: 2023-01-03 | Disposition: A | Discharge status: Home / Self Care | Payer: 59 | Source: Ambulatory Visit | Attending: Orthopedic Surgery | Admitting: Orthopedic Surgery

## 2023-01-03 ENCOUNTER — Other Ambulatory Visit (HOSPITAL_COMMUNITY): Payer: Self-pay

## 2023-01-03 ENCOUNTER — Ambulatory Visit (HOSPITAL_COMMUNITY): Payer: 59

## 2023-01-03 ENCOUNTER — Other Ambulatory Visit: Payer: Self-pay

## 2023-01-03 DIAGNOSIS — K219 Gastro-esophageal reflux disease without esophagitis: Secondary | ICD-10-CM | POA: Insufficient documentation

## 2023-01-03 DIAGNOSIS — E039 Hypothyroidism, unspecified: Secondary | ICD-10-CM | POA: Insufficient documentation

## 2023-01-03 DIAGNOSIS — J45909 Unspecified asthma, uncomplicated: Secondary | ICD-10-CM | POA: Diagnosis not present

## 2023-01-03 DIAGNOSIS — I10 Essential (primary) hypertension: Secondary | ICD-10-CM | POA: Insufficient documentation

## 2023-01-03 DIAGNOSIS — Z7985 Long-term (current) use of injectable non-insulin antidiabetic drugs: Secondary | ICD-10-CM | POA: Diagnosis not present

## 2023-01-03 DIAGNOSIS — M1711 Unilateral primary osteoarthritis, right knee: Secondary | ICD-10-CM | POA: Diagnosis not present

## 2023-01-03 DIAGNOSIS — M545 Low back pain, unspecified: Secondary | ICD-10-CM | POA: Diagnosis not present

## 2023-01-03 DIAGNOSIS — G8929 Other chronic pain: Secondary | ICD-10-CM | POA: Diagnosis not present

## 2023-01-03 DIAGNOSIS — Z87891 Personal history of nicotine dependence: Secondary | ICD-10-CM | POA: Diagnosis not present

## 2023-01-03 DIAGNOSIS — Z79899 Other long term (current) drug therapy: Secondary | ICD-10-CM | POA: Insufficient documentation

## 2023-01-03 DIAGNOSIS — Z01818 Encounter for other preprocedural examination: Secondary | ICD-10-CM

## 2023-01-03 HISTORY — PX: KNEE ARTHROPLASTY: SHX992

## 2023-01-03 LAB — GLUCOSE, CAPILLARY: Glucose-Capillary: 82 mg/dL (ref 70–99)

## 2023-01-03 SURGERY — ARTHROPLASTY, KNEE, TOTAL, USING IMAGELESS COMPUTER-ASSISTED NAVIGATION
Anesthesia: Spinal | Site: Knee | Laterality: Right

## 2023-01-03 MED ORDER — ACETAMINOPHEN 325 MG PO TABS: 325.0000 mg | ORAL_TABLET | Freq: Four times a day (QID) | ORAL | Status: DC | PRN

## 2023-01-03 MED ORDER — DEXAMETHASONE SODIUM PHOSPHATE 10 MG/ML IJ SOLN
INTRAMUSCULAR | Status: DC | PRN
  Administered 2023-01-03: 10 mg via INTRAVENOUS

## 2023-01-03 MED ORDER — DEXAMETHASONE SODIUM PHOSPHATE 10 MG/ML IJ SOLN
INTRAMUSCULAR | Status: AC
  Filled 2023-01-03: qty 1

## 2023-01-03 MED ORDER — BUPIVACAINE HCL (PF) 0.25 % IJ SOLN
INTRAMUSCULAR | Status: AC
  Filled 2023-01-03: qty 30

## 2023-01-03 MED ORDER — POVIDONE-IODINE 10 % EX SWAB
2.0000 | Freq: Once | CUTANEOUS | Status: AC
  Administered 2023-01-03: 2 via TOPICAL

## 2023-01-03 MED ORDER — LIDOCAINE HCL (PF) 2 % IJ SOLN
INTRAMUSCULAR | Status: AC
  Filled 2023-01-03: qty 5

## 2023-01-03 MED ORDER — ASPIRIN 81 MG PO CHEW: 81.0000 mg | CHEWABLE_TABLET | Freq: Two times a day (BID) | ORAL | 0 refills | Status: AC

## 2023-01-03 MED ORDER — ONDANSETRON HCL 4 MG/2ML IJ SOLN
INTRAMUSCULAR | Status: DC | PRN
  Administered 2023-01-03: 4 mg via INTRAVENOUS

## 2023-01-03 MED ORDER — METHOCARBAMOL 500 MG PO TABS: 500.0000 mg | ORAL_TABLET | Freq: Four times a day (QID) | ORAL | Status: DC | PRN

## 2023-01-03 MED ORDER — PROPOFOL 10 MG/ML IV BOLUS
INTRAVENOUS | Status: DC | PRN
  Administered 2023-01-03: 70 mg via INTRAVENOUS

## 2023-01-03 MED ORDER — PHENYLEPHRINE HCL (PRESSORS) 10 MG/ML IV SOLN
INTRAVENOUS | Status: AC
  Filled 2023-01-03: qty 1

## 2023-01-03 MED ORDER — LACTATED RINGERS IV SOLN: INTRAVENOUS | Status: DC

## 2023-01-03 MED ORDER — SODIUM CHLORIDE (PF) 0.9 % IJ SOLN
INTRAMUSCULAR | Status: AC
  Filled 2023-01-03: qty 50

## 2023-01-03 MED ORDER — ONDANSETRON HCL 4 MG PO TABS: 4.0000 mg | ORAL_TABLET | Freq: Three times a day (TID) | ORAL | 0 refills | Status: AC | PRN

## 2023-01-03 MED ORDER — ISOPROPYL ALCOHOL 70 % SOLN
Status: DC | PRN
  Administered 2023-01-03: 1 via TOPICAL

## 2023-01-03 MED ORDER — KETOROLAC TROMETHAMINE 30 MG/ML IJ SOLN
INTRAMUSCULAR | Status: AC
  Filled 2023-01-03: qty 1

## 2023-01-03 MED ORDER — METHOCARBAMOL 500 MG IVPB - SIMPLE MED: 500.0000 mg | Freq: Four times a day (QID) | INTRAVENOUS | Status: DC | PRN

## 2023-01-03 MED ORDER — LACTATED RINGERS IV BOLUS
250.0000 mL | Freq: Once | INTRAVENOUS | Status: AC
  Administered 2023-01-03: 250 mL via INTRAVENOUS

## 2023-01-03 MED ORDER — SODIUM CHLORIDE 0.9% IV SOLUTION
INTRAVENOUS | Status: DC | PRN
  Administered 2023-01-03: 1000 mL

## 2023-01-03 MED ORDER — CEFAZOLIN SODIUM-DEXTROSE 2-4 GM/100ML-% IV SOLN
INTRAVENOUS | Status: AC
  Filled 2023-01-03: qty 100

## 2023-01-03 MED ORDER — PROPOFOL 500 MG/50ML IV EMUL
INTRAVENOUS | Status: DC | PRN
  Administered 2023-01-03: 90 ug/kg/min via INTRAVENOUS

## 2023-01-03 MED ORDER — LIDOCAINE HCL (PF) 2 % IJ SOLN
INTRAMUSCULAR | Status: DC | PRN
  Administered 2023-01-03: 60 mg via INTRADERMAL

## 2023-01-03 MED ORDER — ROPIVACAINE HCL 7.5 MG/ML IJ SOLN
INTRAMUSCULAR | Status: DC | PRN
  Administered 2023-01-03: 20 mL via PERINEURAL

## 2023-01-03 MED ORDER — MIDAZOLAM HCL 2 MG/2ML IJ SOLN
INTRAMUSCULAR | Status: AC
  Filled 2023-01-03: qty 2

## 2023-01-03 MED ORDER — FENTANYL CITRATE (PF) 100 MCG/2ML IJ SOLN
INTRAMUSCULAR | Status: DC | PRN
  Administered 2023-01-03 (×2): 50 ug via INTRAVENOUS

## 2023-01-03 MED ORDER — HYDROMORPHONE HCL 2 MG PO TABS
2.0000 mg | ORAL_TABLET | ORAL | 0 refills | Status: DC | PRN
  Filled 2023-01-03: qty 42, 7d supply, fill #0

## 2023-01-03 MED ORDER — TRANEXAMIC ACID-NACL 1000-0.7 MG/100ML-% IV SOLN
1000.0000 mg | INTRAVENOUS | Status: AC
  Administered 2023-01-03: 1000 mg via INTRAVENOUS
  Filled 2023-01-03: qty 100

## 2023-01-03 MED ORDER — OXYCODONE HCL 20 MG PO TABS: 20.0000 mg | ORAL_TABLET | ORAL | 0 refills | Status: DC | PRN

## 2023-01-03 MED ORDER — BUPIVACAINE IN DEXTROSE 0.75-8.25 % IT SOLN
INTRATHECAL | Status: DC | PRN
  Administered 2023-01-03: 1.8 mL via INTRATHECAL

## 2023-01-03 MED ORDER — ONDANSETRON HCL 4 MG/2ML IJ SOLN
INTRAMUSCULAR | Status: AC
  Filled 2023-01-03: qty 2

## 2023-01-03 MED ORDER — LACTATED RINGERS IV BOLUS
500.0000 mL | Freq: Once | INTRAVENOUS | Status: AC
  Administered 2023-01-03: 500 mL via INTRAVENOUS

## 2023-01-03 MED ORDER — MIDAZOLAM HCL 2 MG/2ML IJ SOLN
INTRAMUSCULAR | Status: DC | PRN
  Administered 2023-01-03 (×2): 2 mg via INTRAVENOUS

## 2023-01-03 MED ORDER — DOCUSATE SODIUM 100 MG PO CAPS: 100.0000 mg | ORAL_CAPSULE | Freq: Two times a day (BID) | ORAL | 1 refills | Status: AC

## 2023-01-03 MED ORDER — BUPIVACAINE HCL (PF) 0.25 % IJ SOLN
INTRAMUSCULAR | Status: DC | PRN
  Administered 2023-01-03: 30 mL

## 2023-01-03 MED ORDER — CHLORHEXIDINE GLUCONATE 0.12 % MT SOLN
15.0000 mL | Freq: Once | OROMUCOSAL | Status: AC
  Administered 2023-01-03: 15 mL via OROMUCOSAL

## 2023-01-03 MED ORDER — METHOCARBAMOL 500 MG PO TABS
ORAL_TABLET | ORAL | Status: AC
  Administered 2023-01-03: 500 mg via ORAL
  Filled 2023-01-03: qty 1

## 2023-01-03 MED ORDER — ORAL CARE MOUTH RINSE: 15.0000 mL | Freq: Once | OROMUCOSAL | Status: AC

## 2023-01-03 MED ORDER — OXYCODONE HCL 5 MG PO TABS
5.0000 mg | ORAL_TABLET | Freq: Once | ORAL | Status: AC | PRN
  Administered 2023-01-03: 5 mg via ORAL

## 2023-01-03 MED ORDER — CELECOXIB 200 MG PO CAPS
200.0000 mg | ORAL_CAPSULE | Freq: Two times a day (BID) | ORAL | Status: DC
  Administered 2023-01-03: 200 mg via ORAL

## 2023-01-03 MED ORDER — CEFAZOLIN SODIUM-DEXTROSE 2-4 GM/100ML-% IV SOLN
2.0000 g | Freq: Four times a day (QID) | INTRAVENOUS | Status: DC
  Administered 2023-01-03: 2 g via INTRAVENOUS

## 2023-01-03 MED ORDER — PHENYLEPHRINE HCL-NACL 20-0.9 MG/250ML-% IV SOLN
INTRAVENOUS | Status: DC | PRN
  Administered 2023-01-03: 40 ug/min via INTRAVENOUS

## 2023-01-03 MED ORDER — CELECOXIB 200 MG PO CAPS: 200.0000 mg | ORAL_CAPSULE | Freq: Two times a day (BID) | ORAL | 0 refills | Status: AC

## 2023-01-03 MED ORDER — POLYETHYLENE GLYCOL 3350 17 G PO PACK: 17.0000 g | PACK | Freq: Every day | ORAL | 0 refills | Status: DC | PRN

## 2023-01-03 MED ORDER — FENTANYL CITRATE PF 50 MCG/ML IJ SOSY: 25.0000 ug | PREFILLED_SYRINGE | INTRAMUSCULAR | Status: DC | PRN

## 2023-01-03 MED ORDER — SENNA 8.6 MG PO TABS: 2.0000 | ORAL_TABLET | Freq: Every day | ORAL | 1 refills | Status: AC

## 2023-01-03 MED ORDER — POVIDONE-IODINE 10 % EX SWAB: 2.0000 | Freq: Once | CUTANEOUS | Status: DC

## 2023-01-03 MED ORDER — FENTANYL CITRATE (PF) 100 MCG/2ML IJ SOLN
INTRAMUSCULAR | Status: AC
  Filled 2023-01-03: qty 2

## 2023-01-03 MED ORDER — KETOROLAC TROMETHAMINE 30 MG/ML IJ SOLN
INTRAMUSCULAR | Status: DC | PRN
  Administered 2023-01-03: 30 mg via INTRAMUSCULAR

## 2023-01-03 MED ORDER — CELECOXIB 200 MG PO CAPS
ORAL_CAPSULE | ORAL | Status: AC
  Filled 2023-01-03: qty 1

## 2023-01-03 MED ORDER — PROMETHAZINE HCL 25 MG/ML IJ SOLN: 6.2500 mg | INTRAMUSCULAR | Status: DC | PRN

## 2023-01-03 MED ORDER — OXYCODONE HCL 5 MG/5ML PO SOLN: 5.0000 mg | Freq: Once | ORAL | Status: AC | PRN

## 2023-01-03 MED ORDER — OXYCODONE HCL 5 MG PO TABS
ORAL_TABLET | ORAL | Status: AC
  Filled 2023-01-03: qty 1

## 2023-01-03 MED ORDER — SODIUM CHLORIDE 0.9 % IR SOLN
Status: DC | PRN
  Administered 2023-01-03: 3000 mL

## 2023-01-03 MED ORDER — CEFAZOLIN IN SODIUM CHLORIDE 3-0.9 GM/100ML-% IV SOLN
3.0000 g | INTRAVENOUS | Status: AC
  Administered 2023-01-03: 3 g via INTRAVENOUS
  Filled 2023-01-03: qty 100

## 2023-01-03 MED ORDER — OXYCODONE HCL 5 MG PO TABS: 10.0000 mg | ORAL_TABLET | ORAL | Status: DC | PRN

## 2023-01-03 MED ORDER — SODIUM CHLORIDE 0.9% FLUSH
INTRAVENOUS | Status: DC | PRN
  Administered 2023-01-03: 30 mL via INTRAVENOUS

## 2023-01-03 MED ORDER — ACETAMINOPHEN 500 MG PO TABS
1000.0000 mg | ORAL_TABLET | Freq: Once | ORAL | Status: AC
  Administered 2023-01-03: 1000 mg via ORAL
  Filled 2023-01-03: qty 2

## 2023-01-03 MED ORDER — PROPOFOL 1000 MG/100ML IV EMUL
INTRAVENOUS | Status: AC
  Filled 2023-01-03: qty 100

## 2023-01-03 SURGICAL SUPPLY — 68 items
BAG COUNTER SPONGE SURGICOUNT (BAG) IMPLANT
BAG ZIPLOCK 12X15 (MISCELLANEOUS) IMPLANT
BATTERY INSTRU NAVIGATION (MISCELLANEOUS) ×3 IMPLANT
BLADE SAW RECIPROCATING 77.5 (BLADE) ×1 IMPLANT
BNDG ELASTIC 4X5.8 VLCR STR LF (GAUZE/BANDAGES/DRESSINGS) ×1 IMPLANT
BNDG ELASTIC 6X10 VLCR STRL LF (GAUZE/BANDAGES/DRESSINGS) IMPLANT
BNDG ELASTIC 6X5.8 VLCR STR LF (GAUZE/BANDAGES/DRESSINGS) ×1 IMPLANT
CHLORAPREP W/TINT 26 (MISCELLANEOUS) ×2 IMPLANT
COMP FEM KNEE PS NRW 10 RT (Joint) ×1 IMPLANT
COMP PATELLAR 10X35 METAL (Joint) ×1 IMPLANT
COMPONENT FEM KN PS NRW 10 RT (Joint) IMPLANT
COMPONENT PATELLAR 10X35 METAL (Joint) IMPLANT
COOLER ICEMAN CLASSIC (MISCELLANEOUS) IMPLANT
COVER SURGICAL LIGHT HANDLE (MISCELLANEOUS) ×1 IMPLANT
DERMABOND ADVANCED .7 DNX12 (GAUZE/BANDAGES/DRESSINGS) ×2 IMPLANT
DRAPE SHEET LG 3/4 BI-LAMINATE (DRAPES) ×3 IMPLANT
DRAPE U-SHAPE 47X51 STRL (DRAPES) ×1 IMPLANT
DRSG AQUACEL AG ADV 3.5X10 (GAUZE/BANDAGES/DRESSINGS) ×1 IMPLANT
ELECT BLADE TIP CTD 4 INCH (ELECTRODE) ×1 IMPLANT
ELECT PENCIL ROCKER SW 15FT (MISCELLANEOUS) IMPLANT
ELECT REM PT RETURN 15FT ADLT (MISCELLANEOUS) ×1 IMPLANT
GAUZE SPONGE 4X4 12PLY STRL (GAUZE/BANDAGES/DRESSINGS) ×1 IMPLANT
GLOVE BIO SURGEON STRL SZ7 (GLOVE) ×1 IMPLANT
GLOVE BIO SURGEON STRL SZ8.5 (GLOVE) ×2 IMPLANT
GLOVE BIOGEL PI IND STRL 7.5 (GLOVE) ×1 IMPLANT
GLOVE BIOGEL PI IND STRL 8.5 (GLOVE) ×1 IMPLANT
GOWN SPEC L3 XXLG W/TWL (GOWN DISPOSABLE) ×1 IMPLANT
GOWN STRL REUS W/ TWL XL LVL3 (GOWN DISPOSABLE) ×1 IMPLANT
GOWN STRL REUS W/TWL XL LVL3 (GOWN DISPOSABLE) ×1
HANDPIECE INTERPULSE COAX TIP (DISPOSABLE) ×1
HDLS TROCR DRIL PIN KNEE 75 (PIN) ×2
HOLDER FOLEY CATH W/STRAP (MISCELLANEOUS) ×1 IMPLANT
HOOD PEEL AWAY T7 (MISCELLANEOUS) ×3 IMPLANT
INSERT TIB AS PS EF/3-11 10 RT (Insert) IMPLANT
KIT TURNOVER KIT A (KITS) IMPLANT
MARKER SKIN DUAL TIP RULER LAB (MISCELLANEOUS) ×1 IMPLANT
NDL SAFETY ECLIP 18X1.5 (MISCELLANEOUS) ×1 IMPLANT
NDL SPNL 18GX3.5 QUINCKE PK (NEEDLE) ×1 IMPLANT
NEEDLE SPNL 18GX3.5 QUINCKE PK (NEEDLE) ×1 IMPLANT
NS IRRIG 1000ML POUR BTL (IV SOLUTION) ×1 IMPLANT
PACK TOTAL KNEE CUSTOM (KITS) ×1 IMPLANT
PAD COLD SHLDR WRAP-ON (PAD) IMPLANT
PADDING CAST COTTON 6X4 STRL (CAST SUPPLIES) ×1 IMPLANT
PIN DRILL HDLS TROCAR 75 4PK (PIN) IMPLANT
PROS TIB KNEE PS 0D F RT (Joint) ×1 IMPLANT
PROSTHESIS TIB KNEE PS 0D F RT (Joint) IMPLANT
PROTECTOR NERVE ULNAR (MISCELLANEOUS) ×1 IMPLANT
SAW OSC TIP CART 19.5X105X1.3 (SAW) ×1 IMPLANT
SCREW FEMALE HEX FIX 25X2.5 (ORTHOPEDIC DISPOSABLE SUPPLIES) IMPLANT
SEALER BIPOLAR AQUA 6.0 (INSTRUMENTS) ×1 IMPLANT
SET HNDPC FAN SPRY TIP SCT (DISPOSABLE) ×1 IMPLANT
SET PAD KNEE POSITIONER (MISCELLANEOUS) ×1 IMPLANT
SOLUTION PRONTOSAN WOUND 350ML (IRRIGATION / IRRIGATOR) IMPLANT
SPIKE FLUID TRANSFER (MISCELLANEOUS) ×2 IMPLANT
SUT MNCRL AB 3-0 PS2 18 (SUTURE) ×1 IMPLANT
SUT MNCRL AB 4-0 PS2 18 (SUTURE) IMPLANT
SUT MON AB 2-0 CT1 36 (SUTURE) ×1 IMPLANT
SUT STRATAFIX PDO 1 14 VIOLET (SUTURE) ×1
SUT STRATFX PDO 1 14 VIOLET (SUTURE) ×1
SUT VIC AB 1 CTX 36 (SUTURE) ×2
SUT VIC AB 1 CTX36XBRD ANBCTR (SUTURE) ×2 IMPLANT
SUT VIC AB 2-0 CT1 27 (SUTURE) ×1
SUT VIC AB 2-0 CT1 TAPERPNT 27 (SUTURE) ×1 IMPLANT
SUTURE STRATFX PDO 1 14 VIOLET (SUTURE) ×1 IMPLANT
SYR 3ML LL SCALE MARK (SYRINGE) ×1 IMPLANT
TRAY FOLEY MTR SLVR 16FR STAT (SET/KITS/TRAYS/PACK) IMPLANT
TUBE SUCTION HIGH CAP CLEAR NV (SUCTIONS) ×1 IMPLANT
WATER STERILE IRR 1000ML POUR (IV SOLUTION) ×2 IMPLANT

## 2023-01-03 NOTE — Progress Notes (Signed)
Physical Therapy Treatment Patient Details Name: [name] MRN: [id number] DOB: 1971-04-21 Today's Date: 01/03/2023   History of Present Illness Pt is 52 yo female admitted 01/03/23 for R TKA.  Pt with hx including GERD, HTN, sleep apnea, obesity, panniculectomy 2022    PT Comments    Pt seen in PACU for second session for same day discharge. Pt's sensation has improved and she was able to ambulate 100' with supervision using RW and perform stairs similar to home set up.  Pt with good ROM and quad activation and pain control.  Pt demonstrates safe gait & transfers in order to return home from PT perspective once discharged by MD.  While in hospital, will continue to benefit from PT for skilled therapy to advance mobility and exercises.      Recommendations for follow up therapy are one component of a multi-disciplinary discharge planning process, led by the attending physician.  Recommendations may be updated based on patient status, additional functional criteria and insurance authorization.  Follow Up Recommendations  Follow physician's recommendations for discharge plan and follow up therapies     Assistance Recommended at Discharge Intermittent Supervision/Assistance  Patient can return home with the following A little help with walking and/or transfers;A little help with bathing/dressing/bathroom   Equipment Recommendations  Rolling walker (2 wheels)    Recommendations for Other Services       Precautions / Restrictions Precautions Precautions: Fall Restrictions Weight Bearing Restrictions: Yes RLE Weight Bearing: Weight bearing as tolerated     Mobility  Bed Mobility Overal bed mobility: Needs Assistance Bed Mobility: Supine to Sit, Sit to Supine     Supine to sit: Supervision Sit to supine: Supervision        Transfers Overall transfer level: Needs assistance Equipment used: Rolling walker (2 wheels) Transfers: Sit to/from Stand Sit to Stand: Min guard,  Supervision           General transfer comment: Min guard progressing to supervision; performed from bed and toilet; cues for R LE managment    Ambulation/Gait Ambulation/Gait assistance: Supervision, Min guard Gait Distance (Feet): 100 Feet Assistive device: Rolling walker (2 wheels) Gait Pattern/deviations: Step-to pattern, Decreased stride length, Decreased weight shift to right Gait velocity: decreased     General Gait Details: Initial min guard progressing to supervision; initial cues for RW proximity   Stairs Stairs: Yes Stairs assistance: Min guard Stair Management: Two rails, Step to pattern, Forwards Number of Stairs: 3 General stair comments: Min guard and educated on sequencing; performed safely   Wheelchair Mobility    Modified Rankin (Stroke Patients Only)       Balance Overall balance assessment: Needs assistance Sitting-balance support: No upper extremity supported Sitting balance-Leahy Scale: Normal     Standing balance support: Bilateral upper extremity supported, No upper extremity supported Standing balance-Leahy Scale: Fair Standing balance comment: RW to ambulate but able to stand for ADLs without UE support (weight shifted to L)                            Cognition Arousal/Alertness: Awake/alert Behavior During Therapy: WFL for tasks assessed/performed Overall Cognitive Status: Within Functional Limits for tasks assessed                                          Exercises Total Joint Exercises Ankle Circles/Pumps: AROM, Both,  10 reps, Supine Quad Sets: AROM, Both, 10 reps, Supine Heel Slides: AAROM, Right, 5 reps, Supine Hip ABduction/ADduction: AAROM, 5 reps, Supine Long Arc Quad: AROM, Right, 5 reps, Seated Knee Flexion: AROM, Right, 5 reps, Seated Goniometric ROM: R knee 5 to 85 degrees    General Comments General comments (skin integrity, edema, etc.): VSS   Educated on safe ice use, no pivots, car  transfers, resting with leg straight, and TED hose during day. Also, encouraged walking every 1-2 hours during day. Educated on HEP with focus on mobility the first weeks. Discussed doing exercises within pain control and if pain increasing could decreased ROM, reps, and stop exercises as needed. Encouraged to perform quad sets and ankle pumps frequently for blood flow and to promote full knee extension.    Pertinent Vitals/Pain Pain Assessment Pain Assessment: 0-10 Pain Score: 4  Pain Location: R knee Pain Descriptors / Indicators: Discomfort Pain Intervention(s): Limited activity within patient's tolerance, Monitored during session, Premedicated before session    Home Living Family/patient expects to be discharged to:: Private residence Living Arrangements: Children Available Help at Discharge: Family;Friend(s);Available 24 hours/day (son, daughter, other family to assist) Type of Home: Mobile home Home Access: Stairs to enter Entrance Stairs-Rails: Right;Left;Can reach both Entrance Stairs-Number of Steps: Ramp then 1 small step   Home Layout: One level Home Equipment: Cane - single point;Toilet riser Additional Comments: shower chair but reports it is too big for shower    Prior Function            PT Goals (current goals can now be found in the care plan section) Acute Rehab PT Goals Patient Stated Goal: return home PT Goal Formulation: With patient/family Time For Goal Achievement: 01/17/23 Potential to Achieve Goals: Good Progress towards PT goals: Progressing toward goals    Frequency    7X/week      PT Plan Current plan remains appropriate    Co-evaluation              AM-PAC PT "6 Clicks" Mobility   Outcome Measure  Help needed turning from your back to your side while in a flat bed without using bedrails?: None Help needed moving from lying on your back to sitting on the side of a flat bed without using bedrails?: A Little Help needed moving to and  from a bed to a chair (including a wheelchair)?: A Little Help needed standing up from a chair using your arms (e.g., wheelchair or bedside chair)?: A Little Help needed to walk in hospital room?: A Little Help needed climbing 3-5 steps with a railing? : A Little 6 Click Score: 19    End of Session Equipment Utilized During Treatment: Gait belt Activity Tolerance: Patient tolerated treatment well Patient left: in bed;with call bell/phone within reach;with family/visitor present Nurse Communication: Mobility status PT Visit Diagnosis: Other abnormalities of gait and mobility (R26.89);Muscle weakness (generalized) (M62.81)     Time: [id number] PT Time Calculation (min) (ACUTE ONLY): 34 min  Charges:  $Gait Training: 8-22 mins $Therapeutic Exercise: 8-22 mins                     Abran Richard, PT Acute Rehab [facility] [place] 01/03/2023, 3:53 PM

## 2023-01-03 NOTE — Op Note (Signed)
OPERATIVE REPORT  SURGEON: Rod Can, MD   ASSISTANT: Larene Pickett, PA-C  PREOPERATIVE DIAGNOSIS: Primary Right knee arthritis.   POSTOPERATIVE DIAGNOSIS: Primary Right knee arthritis.   PROCEDURE: Computer assisted Right total knee arthroplasty.   IMPLANTS: Zimmer Persona PPS Cementless CR femur, size 10 Narrow. Persona 0 degree Spiked Keel OsseoTi Tibia, size F. Vivacit-E polyethelyene insert, size 10 mm, CR. TM standard patella, size 35 mm.  ANESTHESIA:  MAC, Regional, and Spinal  TOURNIQUET TIME: Not utilized.   ESTIMATED BLOOD LOSS:-350 mL    ANTIBIOTICS: 3 g Ancef.  DRAINS: None.  COMPLICATIONS: None   CONDITION: PACU - hemodynamically stable.   BRIEF CLINICAL NOTE: [name] is a 52 y.o. female with a long-standing history of Right knee arthritis. After failing conservative management, the patient was indicated for total knee arthroplasty. The risks, benefits, and alternatives to the procedure were explained, and the patient elected to proceed.  PROCEDURE IN DETAIL: Adductor canal block was obtained in the pre-op holding area. Once inside the operative room, spinal anesthesia was obtained, and a foley catheter was inserted. The patient was then positioned and the lower extremity was prepped and draped in the normal sterile surgical fashion.  A time-out was called verifying side and site of surgery. The patient received IV antibiotics within 60 minutes of beginning the procedure. A tourniquet was not utilized.   An anterior approach to the knee was performed utilizing a midvastus arthrotomy. A medial release was performed and the patellar fat pad was excised. Stryker imageless navigation was used to cut the distal femur perpendicular to the mechanical axis. A freehand patellar resection was performed, and the patella was sized an prepared with one lug hole.  Nagivation was used to make a neutral proximal tibia resection, taking 3 mm of bone from the less affected  medial side with 3 degrees of slope. The menisci were excised. A spacer block was placed, and the alignment and balance in extension were confirmed.   The distal femur was sized using the 3-degree external rotation guide referencing the posterior femoral cortex. The appropriate 4-in-1 cutting block was pinned into place. Rotation was checked using Whiteside's line, the epicondylar axis, and then confirmed with a spacer block in flexion. The remaining femoral cuts were performed, taking care to protect the MCL.  The tibia was sized and the trial tray was pinned into place. The remaining trail components were inserted. The knee was stable to varus and valgus stress through a full range of motion. The patella tracked centrally, and the PCL was well balanced. The trial components were removed, and the proximal tibial surface was prepared. Final components were impacted into place. The knee was tested for a final time and found to be well balanced.   The wound was copiously irrigated with Prontosan solution and normal saline using pulse lavage.  Marcaine solution was injected into the periarticular soft tissue.  The wound was closed in layers using #1 Vicryl and Stratafix for the fascia, 2-0 Vicryl for the subcutaneous fat, 2-0 Monocryl for the deep dermal layer, 3-0 running Monocryl subcuticular Stitch, and 4-0 Monocryl stay sutures at both ends of the wound. Dermabond was applied to the skin.  Once the glue was fully dried, an Aquacell Ag and compressive dressing were applied.  The patient was transported to the recovery room in stable condition.  Sponge, needle, and instrument counts were correct at the end of the case x2.  The patient tolerated the procedure well and there were no  known complications.  Please note that a surgical assistant was a medical necessity for this procedure in order to perform it in a safe and expeditious manner. Surgical assistant was necessary to retract the ligaments and vital  neurovascular structures to prevent injury to them and also necessary for proper positioning of the limb to allow for anatomic placement of the prosthesis.

## 2023-01-03 NOTE — Transfer of Care (Signed)
Immediate Anesthesia Transfer of Care Note  Patient: [name]  Procedure(s) Performed: COMPUTER ASSISTED TOTAL KNEE ARTHROPLASTY (Right: Knee)  Patient Location: PACU  Anesthesia Type:Spinal  Level of Consciousness: awake, alert , oriented, and patient cooperative  Airway & Oxygen Therapy: Patient Spontanous Breathing and Patient connected to face mask oxygen  Post-op Assessment: Report given to RN and Post -op Vital signs reviewed and stable  Post vital signs: Reviewed and stable  Last Vitals:  Vitals Value Taken Time  BP 114/56 01/03/23 1022  Temp    Pulse 76 01/03/23 1026  Resp 20 01/03/23 1026  SpO2 99 % 01/03/23 1026  Vitals shown include unvalidated device data.  Last Pain:  Vitals:   01/03/23 0606  TempSrc: Oral  PainSc:          Complications: No notable events documented.

## 2023-01-03 NOTE — Anesthesia Postprocedure Evaluation (Signed)
Anesthesia Post Note  Patient: [name]  Procedure(s) Performed: COMPUTER ASSISTED TOTAL KNEE ARTHROPLASTY (Right: Knee)     Patient location during evaluation: PACU Anesthesia Type: Spinal Level of consciousness: awake and alert Pain management: pain level controlled Vital Signs Assessment: post-procedure vital signs reviewed and stable Respiratory status: spontaneous breathing and respiratory function stable Cardiovascular status: blood pressure returned to baseline and stable Postop Assessment: spinal receding and no apparent nausea or vomiting Anesthetic complications: no   No notable events documented.  Last Vitals:  Vitals:   01/03/23 1130 01/03/23 1148  BP: 122/79 115/88  Pulse: 77 76  Resp: 17 20  Temp:  (!) 36.2 C  SpO2: 100% 97%    Last Pain:  Vitals:   01/03/23 1148  TempSrc: Temporal  PainSc: [place]

## 2023-01-03 NOTE — OR Nursing (Signed)
PT at bedside w/patient in Phase II.

## 2023-01-03 NOTE — OR Nursing (Signed)
PT at bedside states patient is still numb with standing, PT to return in approximately 1 hour to work with patient.

## 2023-01-03 NOTE — Anesthesia Procedure Notes (Addendum)
Spinal  Patient location during procedure: OR Start time: 01/03/2023 7:42 AM End time: 01/03/2023 7:46 AM Reason for block: surgical anesthesia Staffing Performed: anesthesiologist  Anesthesiologist: Audry Pili, MD Performed by: Audry Pili, MD Authorized by: Audry Pili, MD   Preanesthetic Checklist Completed: patient identified, IV checked, risks and benefits discussed, surgical consent, monitors and equipment checked, pre-op evaluation and timeout performed Spinal Block Patient position: sitting Prep: DuraPrep Patient monitoring: heart rate, cardiac monitor, continuous pulse ox and blood pressure Approach: midline Location: L3-4 Injection technique: single-shot Needle Needle type: Pencan  Needle gauge: 24 G Additional Notes Consent was obtained prior to the procedure with all questions answered and concerns addressed. Risks including, but not limited to, bleeding, infection, nerve damage, paralysis, failed block, inadequate analgesia, allergic reaction, high spinal, itching, and headache were discussed and the patient wished to proceed. Functioning IV was confirmed and monitors were applied. Sterile prep and drape, including hand hygiene, mask, and sterile gloves were used. The patient was positioned and the spine was prepped. The skin was anesthetized with lidocaine. Free flow of clear CSF was obtained prior to injecting local anesthetic into the CSF. The spinal needle aspirated freely following injection. The needle was carefully withdrawn. The patient tolerated the procedure well.   Renold Don, MD

## 2023-01-03 NOTE — Interval H&P Note (Signed)
History and Physical Interval Note:  01/03/2023 7:31 AM  [name]  has presented today for surgery, with the diagnosis of Right knee osteoarthritits.  The various methods of treatment have been discussed with the patient and family. After consideration of risks, benefits and other options for treatment, the patient has consented to  Procedure(s) with comments: [name] (Right) - 160 as a surgical intervention.  The patient's history has been reviewed, patient examined, no change in status, stable for surgery.  I have reviewed the patient's chart and labs.  Questions were answered to the patient's satisfaction.     Hilton Cork Kaspian Muccio

## 2023-01-03 NOTE — Evaluation (Signed)
Physical Therapy Evaluation Patient Details Name: [name] MRN: [id number] DOB: [birth year] Today's Date: 01/03/2023  History of Present Illness  Pt is 52 yo female admitted 01/03/23 for R TKA.  Pt with hx including GERD, HTN, sleep apnea, obesity, panniculectomy 2022  Clinical Impression  Pt is s/p TKA resulting in the deficits listed below (see PT Problem List). Pt is normally independent.  She will have support at discharge.  Pt asked to see pt for possible same day d/c.  Pt was able to SLR without lag and reported ability to feel buttock and feet ;however, upon standing leaning forward requiring mod A and reports buttock numb.  Pt returned to supine.  Will f/u later this afternoon to further assess.  Pt will benefit from skilled PT to increase their independence and safety with mobility to allow discharge to the venue listed below.         Recommendations for follow up therapy are one component of a multi-disciplinary discharge planning process, led by the attending physician.  Recommendations may be updated based on patient status, additional functional criteria and insurance authorization.  Follow Up Recommendations Follow physician's recommendations for discharge plan and follow up therapies      Assistance Recommended at Discharge Frequent or constant Supervision/Assistance  Patient can return home with the following  A little help with walking and/or transfers;A little help with bathing/dressing/bathroom;Assistance with cooking/housework;Help with stairs or ramp for entrance    Equipment Recommendations Rolling walker (2 wheels)  Recommendations for Other Services       Functional Status Assessment Patient has had a recent decline in their functional status and demonstrates the ability to make significant improvements in function in a reasonable and predictable amount of time.     Precautions / Restrictions Precautions Precautions: Fall Restrictions Weight Bearing  Restrictions: Yes RLE Weight Bearing: Weight bearing as tolerated      Mobility  Bed Mobility Overal bed mobility: Needs Assistance Bed Mobility: Supine to Sit, Sit to Supine     Supine to sit: Supervision Sit to supine: Supervision        Transfers Overall transfer level: Needs assistance Equipment used: Rolling walker (2 wheels) Transfers: Sit to/from Stand Sit to Stand: Mod assist           General transfer comment: Cues for R LE management and hand placement; Pt with increased anterior lean with standing requiring mod A to stabilize and reports buttock feels numb; returned to sitting    Ambulation/Gait                  Stairs            Wheelchair Mobility    Modified Rankin (Stroke Patients Only)       Balance Overall balance assessment: Needs assistance Sitting-balance support: No upper extremity supported Sitting balance-Leahy Scale: Normal     Standing balance support: Bilateral upper extremity supported Standing balance-Leahy Scale: Poor                               Pertinent Vitals/Pain Pain Assessment Pain Assessment: 0-10 Pain Score: 1  Pain Location: R knee Pain Descriptors / Indicators: Pressure Pain Intervention(s): Limited activity within patient's tolerance, Monitored during session, Premedicated before session    Home Living Family/patient expects to be discharged to:: Private residence Living Arrangements: Children Available Help at Discharge: Family;Friend(s);Available 24 hours/day (son, daughter, other family to assist) Type of Home: Mobile home  Home Access: Stairs to enter Entrance Stairs-Rails: Right;Left;Can reach both Entrance Stairs-Number of Steps: Ramp then 1 small step   Home Layout: One level Home Equipment: Cane - single point;Toilet riser Additional Comments: shower chair but reports it is too big for shower    Prior Function Prior Level of Function : Independent/Modified  Independent;Driving             Mobility Comments: Would occasionally use cane b/c of pain; Could ambulate in community ADLs Comments: Independent with adls and iadls     Hand Dominance        Extremity/Trunk Assessment   Upper Extremity Assessment Upper Extremity Assessment: Overall WFL for tasks assessed    Lower Extremity Assessment Lower Extremity Assessment: LLE deficits/detail;RLE deficits/detail RLE Deficits / Details: Expected post op changes: ROM: knee 5 to 85 degrees, MMT: 5/5 ankle, 3/5 hip and knee; able to SLR without lag;  pt reported tingling in toes but was able to feel light touch; reports sensation in buttock had returned however upon standing felt numb LLE Deficits / Details: ROM WFL; MMT 5/5;pt reported tingling in toes but was able to feel light touch; reports sensation in buttock had returned however upon standing felt numb    Cervical / Trunk Assessment Cervical / Trunk Assessment: Normal  Communication   Communication: No difficulties  Cognition Arousal/Alertness: Awake/alert Behavior During Therapy: WFL for tasks assessed/performed Overall Cognitive Status: Within Functional Limits for tasks assessed                                          General Comments      Exercises     Assessment/Plan    PT Assessment Patient needs continued PT services  PT Problem List Decreased strength;Pain;Decreased range of motion;Decreased activity tolerance;Decreased balance;Decreased mobility;Decreased safety awareness;Decreased knowledge of precautions;Decreased knowledge of use of DME       PT Treatment Interventions DME instruction;Therapeutic exercise;Gait training;Stair training;Therapeutic activities;Patient/family education;Balance training;Functional mobility training    PT Goals (Current goals can be found in the Care Plan section)  Acute Rehab PT Goals Patient Stated Goal: return home PT Goal Formulation: With  patient/family Time For Goal Achievement: 01/17/23 Potential to Achieve Goals: Good    Frequency 7X/week     Co-evaluation               AM-PAC PT "6 Clicks" Mobility  Outcome Measure Help needed turning from your back to your side while in a flat bed without using bedrails?: A Little Help needed moving from lying on your back to sitting on the side of a flat bed without using bedrails?: A Little Help needed moving to and from a bed to a chair (including a wheelchair)?: A Lot Help needed standing up from a chair using your arms (e.g., wheelchair or bedside chair)?: A Lot Help needed to walk in hospital room?: Total Help needed climbing 3-5 steps with a railing? : Total 6 Click Score: 12    End of Session Equipment Utilized During Treatment: Gait belt Activity Tolerance: Patient tolerated treatment well Patient left: in bed;with call bell/phone within reach;with family/visitor present Nurse Communication: Mobility status PT Visit Diagnosis: Other abnormalities of gait and mobility (R26.89);Muscle weakness (generalized) (M62.81)    Time: 1207-1224 PT Time Calculation (min) (ACUTE ONLY): 17 min   Charges:   PT Evaluation $PT Eval Low Complexity: 1 Low  Abran Richard, PT Acute Rehab [facility] [phone]   Karlton Lemon 01/03/2023, 1:33 PM

## 2023-01-03 NOTE — Discharge Instructions (Signed)
Dr. Rod Can Total Joint Specialist [facility] [address], [place], [place] [address] [phone]  TOTAL KNEE REPLACEMENT POSTOPERATIVE DIRECTIONS    Knee Rehabilitation, Guidelines Following Surgery  Results after knee surgery are often greatly improved when you follow the exercise, range of motion and muscle strengthening exercises prescribed by your doctor. Safety measures are also important to protect the knee from further injury. Any time any of these exercises cause you to have increased pain or swelling in your knee joint, decrease the amount until you are comfortable again and slowly increase them. If you have problems or questions, call your caregiver or physical therapist for advice.   WEIGHT BEARING Weight bearing as tolerated with assist device (walker, cane, etc) as directed, use it as long as suggested by your surgeon or therapist, typically at least 4-6 weeks.  HOME CARE INSTRUCTIONS  Remove items at home which could result in a fall. This includes throw rugs or furniture in walking pathways.  Continue medications as instructed at time of discharge. You may have some home medications which will be placed on hold until you complete the course of blood thinner medication.  You may start showering once you are discharged home but do not submerge the incision under water. Just pat the incision dry and apply a dry gauze dressing on daily. Walk with walker as instructed.  You may resume a sexual relationship in one month or when given the OK by your doctor.  Use walker as long as suggested by your caregivers. Avoid periods of inactivity such as sitting longer than an hour when not asleep. This helps prevent blood clots.  You may put full weight on your legs and walk as much as is comfortable.  You may return to work once you are cleared by your doctor.  Do not drive a car for 6 weeks or until released by you surgeon.  Do not drive while  taking narcotics.  Wear the elastic stockings for three weeks following surgery during the day but you may remove then at night. Make sure you keep all of your appointments after your operation with all of your doctors and caregivers. You should call the office at the above phone number and make an appointment for approximately two weeks after the date of your surgery. Do not remove your surgical dressing. The dressing is waterproof; you may take showers in 3 days, but do not take tub baths or submerge the dressing. Please pick up a stool softener and laxative for home use as long as you are requiring pain medications. ICE to the affected knee every three hours for 30 minutes at a time and then as needed for pain and swelling.  Continue to use ice on the knee for pain and swelling from surgery. You may notice swelling that will progress down to the foot and ankle.  This is normal after surgery.  Elevate the leg when you are not up walking on it.   It is important for you to complete the blood thinner medication as prescribed by your doctor. Continue to use the breathing machine which will help keep your temperature down.  It is common for your temperature to cycle up and down following surgery, especially at night when you are not up moving around and exerting yourself.  The breathing machine keeps your lungs expanded and your temperature down.  RANGE OF MOTION AND STRENGTHENING EXERCISES  Rehabilitation of the knee is important following a knee injury or an  operation. After just a few days of immobilization, the muscles of the thigh which control the knee become weakened and shrink (atrophy). Knee exercises are designed to build up the tone and strength of the thigh muscles and to improve knee motion. Often times heat used for twenty to thirty minutes before working out will loosen up your tissues and help with improving the range of motion but do not use heat for the first two weeks following surgery.  These exercises can be done on a training (exercise) mat, on the floor, on a table or on a bed. Use what ever works the best and is most comfortable for you Knee exercises include:  Leg Lifts - While your knee is still immobilized in a splint or cast, you can do straight leg raises. Lift the leg to 60 degrees, hold for 3 sec, and slowly lower the leg. Repeat 10-20 times 2-3 times daily. Perform this exercise against resistance later as your knee gets better.  Quad and Hamstring Sets - Tighten up the muscle on the front of the thigh (Quad) and hold for 5-10 sec. Repeat this 10-20 times hourly. Hamstring sets are done by pushing the foot backward against an object and holding for 5-10 sec. Repeat as with quad sets.  A rehabilitation program following serious knee injuries can speed recovery and prevent re-injury in the future due to weakened muscles. Contact your doctor or a physical therapist for more information on knee rehabilitation.   POST-OPERATIVE OPIOID TAPER INSTRUCTIONS: It is important to wean off of your opioid medication as soon as possible. If you do not need pain medication after your surgery it is ok to stop day one. Opioids include: Codeine, Hydrocodone(Norco, Vicodin), Oxycodone(Percocet, oxycontin) and hydromorphone amongst others.  Long term and even short term use of opiods can cause: Increased pain response Dependence Constipation Depression Respiratory depression And more.  Withdrawal symptoms can include Flu like symptoms Nausea, vomiting And more Techniques to manage these symptoms Hydrate well Eat regular healthy meals Stay active Use relaxation techniques(deep breathing, meditating, yoga) Do Not substitute Alcohol to help with tapering If you have been on opioids for less than two weeks and do not have pain than it is ok to stop all together.  Plan to wean off of opioids This plan should start within one week post op of your joint replacement. Maintain the same  interval or time between taking each dose and first decrease the dose.  Cut the total daily intake of opioids by one tablet each day Next start to increase the time between doses. The last dose that should be eliminated is the evening dose.    SKILLED REHAB INSTRUCTIONS: If the patient is transferred to a skilled rehab facility following release from the hospital, a list of the current medications will be sent to the facility for the patient to continue.  When discharged from the skilled rehab facility, please have the facility set up the patient's [place] prior to being released. Also, the skilled facility will be responsible for providing the patient with their medications at time of release from the facility to include their pain medication, the muscle relaxants, and their blood thinner medication. If the patient is still at the rehab facility at time of the two week follow up appointment, the skilled rehab facility will also need to assist the patient in arranging follow up appointment in our office and any transportation needs.  MAKE SURE YOU:  Understand these instructions.  Will watch  your condition.  Will get help right away if you are not doing well or get worse.    Pick up stool softner and laxative for home use following surgery while on pain medications. Do NOT remove your dressing. You may shower.  Do not take tub baths or submerge incision under water. May shower starting three days after surgery. Please use a clean towel to pat the incision dry following showers. Continue to use ice for pain and swelling after surgery. Do not use any lotions or creams on the incision until instructed by your surgeon.

## 2023-01-03 NOTE — OR Nursing (Signed)
PT at bedside in Phase II.

## 2023-01-03 NOTE — Anesthesia Procedure Notes (Signed)
Anesthesia Regional Block: Adductor canal block   Pre-Anesthetic Checklist: , timeout performed,  Correct Patient, Correct Site, Correct Laterality,  Correct Procedure, Correct Position, site marked,  Risks and benefits discussed,  Surgical consent,  Pre-op evaluation,  At surgeon's request and post-op pain management  Laterality: Right  Prep: chloraprep       Needles:  Injection technique: Single-shot  Needle Type: Echogenic Needle     Needle Length: 10cm  Needle Gauge: 21     Additional Needles:   Narrative:  Start time: 01/03/2023 6:55 AM End time: 01/03/2023 6:59 AM Injection made incrementally with aspirations every 5 mL.  Performed by: Personally  Anesthesiologist: Audry Pili, MD  Additional Notes: No pain on injection. No increased resistance to injection. Injection made in 5cc increments. Good needle visualization. Patient tolerated the procedure well.

## 2023-01-04 ENCOUNTER — Encounter (HOSPITAL_COMMUNITY): Payer: Self-pay | Admitting: Orthopedic Surgery

## 2023-07-16 ENCOUNTER — Other Ambulatory Visit (HOSPITAL_COMMUNITY): Payer: Self-pay

## 2023-07-16 MED ORDER — OMEPRAZOLE 40 MG PO CPDR
40.0000 mg | DELAYED_RELEASE_CAPSULE | Freq: Two times a day (BID) | ORAL | 1 refills | Status: AC
  Filled 2023-07-16: qty 180, 90d supply, fill #0

## 2023-07-16 MED ORDER — OXYCODONE HCL 15 MG PO TABS
15.0000 mg | ORAL_TABLET | Freq: Four times a day (QID) | ORAL | 0 refills | Status: AC | PRN
  Filled 2023-07-16: qty 120, 30d supply, fill #0

## 2023-08-12 ENCOUNTER — Other Ambulatory Visit (HOSPITAL_COMMUNITY): Payer: Self-pay

## 2023-08-13 ENCOUNTER — Other Ambulatory Visit (HOSPITAL_COMMUNITY): Payer: Self-pay

## 2023-08-13 MED ORDER — OXYCODONE HCL 15 MG PO TABS
15.0000 mg | ORAL_TABLET | Freq: Four times a day (QID) | ORAL | 0 refills | Status: DC | PRN
  Filled 2023-08-13: qty 120, 30d supply, fill #0

## 2023-09-09 ENCOUNTER — Other Ambulatory Visit (HOSPITAL_COMMUNITY): Payer: Self-pay

## 2023-09-09 MED ORDER — OXYCODONE HCL 15 MG PO TABS
15.0000 mg | ORAL_TABLET | Freq: Four times a day (QID) | ORAL | 0 refills | Status: AC | PRN
  Filled 2023-09-09 – 2023-10-07 (×2): qty 120, 30d supply, fill #0

## 2023-09-09 MED ORDER — TIZANIDINE HCL 6 MG PO CAPS
6.0000 mg | ORAL_CAPSULE | Freq: Three times a day (TID) | ORAL | 0 refills | Status: AC | PRN
  Filled 2023-09-09: qty 78, 26d supply, fill #0
  Filled 2023-09-09: qty 12, 4d supply, fill #0

## 2023-09-09 MED ORDER — OXYCODONE HCL 15 MG PO TABS
15.0000 mg | ORAL_TABLET | Freq: Four times a day (QID) | ORAL | 0 refills | Status: AC | PRN
  Filled 2023-09-09: qty 120, 30d supply, fill #0

## 2023-09-09 MED ORDER — KLOXXADO 8 MG/0.1ML NA LIQD
1.0000 | NASAL | 3 refills | Status: AC | PRN
  Filled 2023-09-09: qty 2, 28d supply, fill #0

## 2023-09-10 ENCOUNTER — Other Ambulatory Visit: Payer: Self-pay

## 2023-09-10 ENCOUNTER — Other Ambulatory Visit (HOSPITAL_COMMUNITY): Payer: Self-pay

## 2023-09-12 ENCOUNTER — Other Ambulatory Visit (HOSPITAL_BASED_OUTPATIENT_CLINIC_OR_DEPARTMENT_OTHER): Payer: Self-pay

## 2023-09-17 ENCOUNTER — Other Ambulatory Visit (HOSPITAL_COMMUNITY): Payer: Self-pay

## 2023-10-07 ENCOUNTER — Other Ambulatory Visit (HOSPITAL_COMMUNITY): Payer: Self-pay

## 2023-10-07 MED ORDER — OXYCODONE HCL 15 MG PO TABS
15.0000 mg | ORAL_TABLET | Freq: Four times a day (QID) | ORAL | 0 refills | Status: AC | PRN
  Filled 2023-10-07 – 2024-03-24 (×3): qty 120, 30d supply, fill #0

## 2023-11-04 ENCOUNTER — Other Ambulatory Visit (HOSPITAL_COMMUNITY): Payer: Self-pay

## 2023-11-04 MED ORDER — OXYCODONE HCL 15 MG PO TABS
15.0000 mg | ORAL_TABLET | Freq: Four times a day (QID) | ORAL | 0 refills | Status: AC | PRN
  Filled 2023-11-04: qty 120, 30d supply, fill #0

## 2023-12-02 ENCOUNTER — Other Ambulatory Visit (HOSPITAL_COMMUNITY): Payer: Self-pay

## 2023-12-02 MED ORDER — OXYCODONE HCL 15 MG PO TABS
15.0000 mg | ORAL_TABLET | Freq: Four times a day (QID) | ORAL | 0 refills | Status: DC | PRN
  Filled 2023-12-02 (×5): qty 120, 30d supply, fill #0

## 2023-12-31 ENCOUNTER — Other Ambulatory Visit (HOSPITAL_COMMUNITY): Payer: Self-pay

## 2023-12-31 MED ORDER — OXYCODONE HCL 15 MG PO TABS
15.0000 mg | ORAL_TABLET | Freq: Four times a day (QID) | ORAL | 0 refills | Status: AC | PRN
  Filled 2023-12-31: qty 120, 30d supply, fill #0

## 2024-01-13 ENCOUNTER — Other Ambulatory Visit (HOSPITAL_COMMUNITY): Payer: Self-pay

## 2024-01-13 MED ORDER — OZEMPIC (2 MG/DOSE) 8 MG/3ML ~~LOC~~ SOPN
2.0000 mg | PEN_INJECTOR | SUBCUTANEOUS | 2 refills | Status: AC
  Filled 2024-01-13: qty 3, 28d supply, fill #0

## 2024-01-15 ENCOUNTER — Other Ambulatory Visit (HOSPITAL_COMMUNITY): Payer: Self-pay

## 2024-01-19 ENCOUNTER — Ambulatory Visit (INDEPENDENT_AMBULATORY_CARE_PROVIDER_SITE_OTHER)
Admit: 2024-01-19 | Discharge: 2024-01-19 | Disposition: A | Discharge status: Home / Self Care | Attending: Family Medicine | Admitting: Family Medicine

## 2024-01-19 ENCOUNTER — Encounter (HOSPITAL_BASED_OUTPATIENT_CLINIC_OR_DEPARTMENT_OTHER): Payer: Self-pay | Admitting: Emergency Medicine

## 2024-01-19 ENCOUNTER — Ambulatory Visit (HOSPITAL_BASED_OUTPATIENT_CLINIC_OR_DEPARTMENT_OTHER): Admission: EM | Admit: 2024-01-19 | Discharge: 2024-01-19 | Disposition: A | Discharge status: Home / Self Care

## 2024-01-19 DIAGNOSIS — R051 Acute cough: Secondary | ICD-10-CM | POA: Diagnosis not present

## 2024-01-19 DIAGNOSIS — R0602 Shortness of breath: Secondary | ICD-10-CM

## 2024-01-19 DIAGNOSIS — R059 Cough, unspecified: Secondary | ICD-10-CM

## 2024-01-19 MED ORDER — PREDNISONE 20 MG PO TABS: 40.0000 mg | ORAL_TABLET | Freq: Every day | ORAL | 0 refills | Status: AC

## 2024-01-19 NOTE — Discharge Instructions (Signed)
 Your chest x-ray did not show any signs of infection.  Will start you on a round of prednisone to help with inflammation in your lungs.  Continue your albuterol as needed.  Follow-up with your primary care for any continued or worsening symptoms

## 2024-01-19 NOTE — ED Provider Notes (Signed)
 [facility]    CSN: [id number] Arrival date & time: 01/19/24  1140      History   Chief Complaint Chief Complaint  Patient presents with   Shortness of Breath   Cough   Nasal Congestion    HPI [name] is a 53 y.o. female.   53 year old female presents today with shortness of breath, pain with inspiration, congestion.  She was seen previously by another provider on Wednesday and given Zithromax.  Feels like she is not getting any better.  Tested negative for COVID and flu.  Has been using her inhalers with no relief.  Denies any fever, chills.   Shortness of Breath Associated symptoms: cough   Cough Associated symptoms: shortness of breath     Past Medical History:  Diagnosis Date   Anxiety    Arthritis    Asthma    Diverticulitis of sigmoid colon    GERD (gastroesophageal reflux disease)    Hiatal hernia    Per pt on 06/04/12   Hypertension    no meds in one year   Hypothyroidism    Morbid obesity (HCC)    Sleep apnea    no cpap used    Patient Active Problem List   Diagnosis Date Noted   Osteoarthritis of right knee 01/03/2023   Obesity 07/09/2012   GERD (gastroesophageal reflux disease) 07/09/2012   HTN (hypertension) 07/09/2012   Sleep apnea 07/09/2012    Past Surgical History:  Procedure Laterality Date   ABDOMINAL HYSTERECTOMY  12 yrs ago   BREATH TEK H PYLORI  06/25/2012   Procedure: BREATH TEK H PYLORI;  Surgeon: Mariella Saa, MD;  Location: [facility];  Service: General;  Laterality: N/A;  Dr. Delice Lesch patient   CALDWELL LUC Right 12/21/2016   CARPAL TUNNEL RELEASE Left 11/06/2018   COLONOSCOPY     ESOPHAGOGASTRODUODENOSCOPY  12/23/2012   Procedure: ESOPHAGOGASTRODUODENOSCOPY (EGD);  Surgeon: Lodema Pilot, DO;  Location: [facility];  Service: General;  Laterality: N/A;   HERNIA REPAIR     INNER EAR SURGERY     KNEE ARTHROPLASTY Right 01/03/2023   Procedure: COMPUTER ASSISTED TOTAL KNEE ARTHROPLASTY;  Surgeon: Samson Frederic, MD;  Location: [facility];  Service: Orthopedics;  Laterality: Right;  160   KNEE ARTHROSCOPY     left   LAPAROSCOPIC GASTRIC SLEEVE RESECTION  12/23/2012   Procedure: LAPAROSCOPIC GASTRIC SLEEVE RESECTION;  Surgeon: Lodema Pilot, DO;  Location: [facility];  Service: General;  Laterality: N/A;   LAPAROSCOPIC SIGMOID COLECTOMY  07/02/2018   NASAL SINUS SURGERY     with stents- 2017   PANNICULECTOMY N/A 02/20/2021   Procedure: Fleur-di-lis panniculectomy;  Surgeon: Allena Napoleon, MD;  Location: [facility];  Service: Plastics;  Laterality: N/A;   TONSILLECTOMY AND ADENOIDECTOMY      OB History   No obstetric history on file.      Home Medications    Prior to Admission medications   Medication Sig Start Date End Date Taking? Authorizing Provider  azithromycin (ZITHROMAX) 250 MG tablet Take 250 mg by mouth as directed. 01/17/24  Yes [provider]  benzonatate (TESSALON) 100 MG capsule Take 100 mg by mouth 3 (three) times daily. 01/17/24  Yes [provider]  esomeprazole (NEXIUM) 40 MG capsule Take by mouth. 04/28/20  Yes [provider]  FEROSUL 325 (65 Fe) MG tablet Take 325 mg by mouth every other day. 01/05/24  Yes [provider]  fluticasone furoate-vilanterol (BREO ELLIPTA) 100-25 MCG/ACT AEPB Inhale  into the lungs. 09/02/17  Yes [provider]  metFORMIN (GLUCOPHAGE) 850 MG tablet Take 850 mg by mouth 2 (two) times daily. 01/17/24  Yes [provider]  metoprolol tartrate (LOPRESSOR) 25 MG tablet Take by mouth. 01/24/17  Yes [provider]  predniSONE (DELTASONE) 20 MG tablet Take 2 tablets (40 mg total) by mouth daily with breakfast for 5 days. 01/19/24 01/24/24 Yes Liannah Yarbough A, FNP  albuterol (VENTOLIN HFA) 108 (90 Base) MCG/ACT inhaler Inhale 1-2 puffs into the lungs every 6 (six) hours as needed for wheezing or shortness of breath.    [provider]  ALPRAZolam Prudy Feeler) 0.5 MG tablet Take 1 tablet by outh 3 times  daily, as needed for anxiety 06/18/22     atorvastatin (LIPITOR) 40 MG tablet Take 40 mg by mouth at bedtime. 01/02/21   [provider]  estrogen, conjugated,-medroxyprogesterone (PREMPRO) 0.3-1.5 MG tablet Take 1 tablet by mouth daily. 11/07/22   [provider]  famotidine (PEPCID) 40 MG tablet Take 40 mg by mouth daily.    [provider]  gabapentin (NEURONTIN) 400 MG capsule Take 1 capsule by mouth 4 times daily for neuropathy Patient taking differently: Take 400 mg by mouth daily as needed (pain). 06/18/22     levothyroxine (SYNTHROID) 100 MCG tablet Take 100 mcg by mouth daily before breakfast. 01/29/21   [provider]  Naloxone HCl (KLOXXADO) 8 MG/0.1ML LIQD Place 1 spray into the nose as needed for respiratory depression. 09/09/23     nitroGLYCERIN (NITROSTAT) 0.4 MG SL tablet Place under the tongue.    [provider]  omeprazole (PRILOSEC) 40 MG capsule Take 1 capsule (40 mg total) by mouth 2 (two) times daily for gastric reflux 07/16/23     ondansetron (ZOFRAN) 4 MG tablet Take 1 tablet (4 mg total) by mouth every 8 (eight) hours as needed for nausea or vomiting. 01/03/23   Swinteck, Arlys John, MD  oxyCODONE (ROXICODONE) 15 MG immediate release tablet Take 1 tablet (15 mg total) by mouth every 6 (six) hours as needed for pain 07/16/23     oxyCODONE (ROXICODONE) 15 MG immediate release tablet Take 1 tablet (15 mg total) by mouth every 6 (six) hours as needed for pain 09/09/23     oxyCODONE (ROXICODONE) 15 MG immediate release tablet Take 1 tablet (15 mg total) by mouth every 6 (six) hours as needed for pain 09/09/23     oxyCODONE (ROXICODONE) 15 MG immediate release tablet Take 1 tablet (15 mg total) by mouth every 6 (six) hours as needed for pain 10/07/23     oxyCODONE (ROXICODONE) 15 MG immediate release tablet Take 1 tablet (15 mg total) by mouth every 6 (six) hours as needed for pain. 11/04/23     oxyCODONE (ROXICODONE) 15 MG immediate release  tablet Take 1 tablet (15 mg total) by mouth every 6 (six) hours as needed for pain 12/31/23     RELISTOR 150 MG TABS Take 150 mg by mouth daily. 04/28/21   [provider]  Semaglutide, 1 MG/DOSE, (OZEMPIC, 1 MG/DOSE,) 4 MG/3ML SOPN Inject 1 mg into the skin once a week.    [provider]  Semaglutide, 2 MG/DOSE, (OZEMPIC, 2 MG/DOSE,) 8 MG/3ML SOPN Inject 2 mg into the skin once a week. 01/13/24     tizanidine (ZANAFLEX) 6 MG capsule Take 1 capsule (6 mg total) by mouth 3 (three) times daily as needed for muscle spasms, Pt instructed on safe medication administration including spacing of opioids and muscle  relaxer 1-2 hours to avoid oversedation 09/09/23     TRULANCE 3 MG TABS Take 1 tablet by mouth daily.    [provider]  valACYclovir (VALTREX) 1000 MG tablet Take 1 tablet by mouth daily as needed (outbreak). 05/10/20   [provider]  Vitamin D, Ergocalciferol, (DRISDOL) 1.25 MG (50000 UNIT) CAPS capsule Take 50,000 Units by mouth once a week. 01/02/21   [provider]    Family History Family History  Problem Relation Age of Onset   Cancer Father        lymphoma   Cancer Paternal Grandmother        breast    Social History Social History   Tobacco Use   Smoking status: Former    Current packs/day: 0.00    Types: Cigarettes    Start date: 07/15/2008    Quit date: 07/15/2013    Years since quitting: 10.5   Smokeless tobacco: Never  Vaping Use   Vaping status: Never Used  Substance Use Topics   Alcohol use: No   Drug use: No     Allergies   Lorazepam, Nitrofurantoin, and Other   Review of Systems Review of Systems  Respiratory:  Positive for cough and shortness of breath.      Physical Exam Triage Vital Signs ED Triage Vitals  Encounter Vitals Group     BP 01/19/24 1147 (!) 153/80     Systolic BP Percentile --      Diastolic BP Percentile --      Pulse Rate 01/19/24 1147 93     Resp 01/19/24 1147 18     Temp 01/19/24  1147 98.1 F (36.7 C)     Temp Source 01/19/24 1147 Oral     SpO2 01/19/24 1147 96 %     Weight --      Height --      Head Circumference --      Peak Flow --      Pain Score 01/19/24 1150 7     Pain Loc --      Pain Education --      Exclude from Growth Chart --    No data found.  Updated Vital Signs BP (!) 153/80 (BP Location: Right Arm)   Pulse 93   Temp 98.1 F (36.7 C) (Oral)   Resp 18   SpO2 96%   Visual Acuity Right Eye Distance:   Left Eye Distance:   Bilateral Distance:    Right Eye Near:   Left Eye Near:    Bilateral Near:     Physical Exam Constitutional:      Appearance: She is well-developed.  HENT:     Head: Normocephalic and atraumatic.  Cardiovascular:     Rate and Rhythm: Normal rate and regular rhythm.  Pulmonary:     Effort: Pulmonary effort is normal.     Breath sounds: Examination of the left-lower field reveals wheezing. Wheezing present.  Musculoskeletal:     Cervical back: Normal range of motion.  Skin:    General: Skin is warm and dry.  Neurological:     Mental Status: She is alert.  Psychiatric:        Mood and Affect: Mood normal.      UC Treatments / Results  Labs (all labs ordered are listed, but only abnormal results are displayed) Labs Reviewed - No data to display  EKG   Radiology DG Chest 2 View Result Date: 01/19/2024 CLINICAL DATA:  Cough and shortness of  breath EXAM: CHEST - 2 VIEW COMPARISON:  01/01/2019 FINDINGS: Normal heart size and mediastinal contours. No acute infiltrate or edema. No effusion or pneumothorax. No acute osseous findings. IMPRESSION: No active cardiopulmonary disease. Electronically Signed   By: Tiburcio Pea M.D.   On: 01/19/2024 12:49    Procedures Procedures (including critical care time)  Medications Ordered in UC Medications - No data to display  Initial Impression / Assessment and Plan / UC Course  I have reviewed the triage vital signs and the nursing notes.  Pertinent labs &  imaging results that were available during my care of the patient were reviewed by me and considered in my medical decision making (see chart for details).    Acute cough and shortness of breath despite taking azithromycin.  Has been using her albuterol inhaler without much relief.  Chest x-ray was normal with no signs of infection.  Prescribing prednisone daily for 5 days to help with inflammation.  She can continue her albuterol as needed.  For any continued or worsening symptoms she can follow-up with her primary care doctor  Final Clinical Impressions(s) / UC Diagnoses   Final diagnoses:  Acute cough  SOB (shortness of breath)     Discharge Instructions      Your chest x-ray did not show any signs of infection.  Will start you on a round of prednisone to help with inflammation in your lungs.  Continue your albuterol as needed.  Follow-up with your primary care for any continued or worsening symptoms     ED Prescriptions     Medication Sig Dispense Auth. Provider   predniSONE (DELTASONE) 20 MG tablet Take 2 tablets (40 mg total) by mouth daily with breakfast for 5 days. 10 tablet Janace Aris, FNP      PDMP not reviewed this encounter.   Janace Aris, FNP 01/19/24 1325

## 2024-01-19 NOTE — ED Triage Notes (Signed)
 Patient presents with c/o dyspnea, pain on inspiration, and nasal congestion x 4 days. Patient states she was seen on Wednesday and was given Zithromax. Patient states she tested negative for Covid and flu on Wednesday.

## 2024-01-27 ENCOUNTER — Other Ambulatory Visit (HOSPITAL_BASED_OUTPATIENT_CLINIC_OR_DEPARTMENT_OTHER): Payer: Self-pay

## 2024-01-28 ENCOUNTER — Other Ambulatory Visit (HOSPITAL_BASED_OUTPATIENT_CLINIC_OR_DEPARTMENT_OTHER): Payer: Self-pay

## 2024-01-28 MED ORDER — OXYCODONE HCL 15 MG PO TABS
15.0000 mg | ORAL_TABLET | Freq: Four times a day (QID) | ORAL | 0 refills | Status: AC | PRN
  Filled 2024-01-28: qty 120, 30d supply, fill #0

## 2024-02-24 ENCOUNTER — Other Ambulatory Visit (HOSPITAL_BASED_OUTPATIENT_CLINIC_OR_DEPARTMENT_OTHER): Payer: Self-pay

## 2024-02-25 ENCOUNTER — Other Ambulatory Visit (HOSPITAL_BASED_OUTPATIENT_CLINIC_OR_DEPARTMENT_OTHER): Payer: Self-pay

## 2024-02-25 MED ORDER — OXYCODONE HCL 15 MG PO TABS
15.0000 mg | ORAL_TABLET | Freq: Four times a day (QID) | ORAL | 0 refills | Status: AC | PRN
  Filled 2024-02-25: qty 120, 30d supply, fill #0

## 2024-03-23 ENCOUNTER — Other Ambulatory Visit (HOSPITAL_BASED_OUTPATIENT_CLINIC_OR_DEPARTMENT_OTHER): Payer: Self-pay

## 2024-03-24 ENCOUNTER — Other Ambulatory Visit (HOSPITAL_BASED_OUTPATIENT_CLINIC_OR_DEPARTMENT_OTHER): Payer: Self-pay

## 2024-03-25 ENCOUNTER — Other Ambulatory Visit (HOSPITAL_BASED_OUTPATIENT_CLINIC_OR_DEPARTMENT_OTHER): Payer: Self-pay

## 2024-03-25 MED ORDER — OXYCODONE HCL 15 MG PO TABS
15.0000 mg | ORAL_TABLET | Freq: Four times a day (QID) | ORAL | 0 refills | Status: AC | PRN
  Filled 2024-03-25 – 2024-04-22 (×3): qty 120, 30d supply, fill #0

## 2024-04-20 ENCOUNTER — Other Ambulatory Visit (HOSPITAL_BASED_OUTPATIENT_CLINIC_OR_DEPARTMENT_OTHER): Payer: Self-pay

## 2024-04-21 ENCOUNTER — Other Ambulatory Visit (HOSPITAL_BASED_OUTPATIENT_CLINIC_OR_DEPARTMENT_OTHER): Payer: Self-pay

## 2024-04-21 MED ORDER — TIZANIDINE HCL 6 MG PO CAPS
6.0000 mg | ORAL_CAPSULE | Freq: Three times a day (TID) | ORAL | 0 refills | Status: AC | PRN
  Filled 2024-04-21: qty 90, 30d supply, fill #0

## 2024-04-21 MED ORDER — OXYCODONE HCL 15 MG PO TABS
15.0000 mg | ORAL_TABLET | Freq: Four times a day (QID) | ORAL | 0 refills | Status: DC | PRN
  Filled 2024-04-21 – 2024-06-16 (×2): qty 120, 30d supply, fill #0

## 2024-04-21 MED ORDER — ALPRAZOLAM 0.5 MG PO TABS
0.5000 mg | ORAL_TABLET | Freq: Three times a day (TID) | ORAL | 0 refills | Status: DC | PRN
  Filled 2024-04-21 – 2024-04-22 (×2): qty 90, 30d supply, fill #0

## 2024-04-22 ENCOUNTER — Other Ambulatory Visit (HOSPITAL_BASED_OUTPATIENT_CLINIC_OR_DEPARTMENT_OTHER): Payer: Self-pay

## 2024-04-29 ENCOUNTER — Other Ambulatory Visit (HOSPITAL_BASED_OUTPATIENT_CLINIC_OR_DEPARTMENT_OTHER): Payer: Self-pay

## 2024-04-30 ENCOUNTER — Other Ambulatory Visit (HOSPITAL_BASED_OUTPATIENT_CLINIC_OR_DEPARTMENT_OTHER): Payer: Self-pay

## 2024-05-01 ENCOUNTER — Other Ambulatory Visit (HOSPITAL_BASED_OUTPATIENT_CLINIC_OR_DEPARTMENT_OTHER): Payer: Self-pay

## 2024-05-01 MED ORDER — MOUNJARO 7.5 MG/0.5ML ~~LOC~~ SOAJ
7.5000 mg | SUBCUTANEOUS | 0 refills | Status: DC
  Filled 2024-05-01: qty 2, 28d supply, fill #0

## 2024-05-18 ENCOUNTER — Other Ambulatory Visit (HOSPITAL_BASED_OUTPATIENT_CLINIC_OR_DEPARTMENT_OTHER): Payer: Self-pay

## 2024-05-19 ENCOUNTER — Other Ambulatory Visit (HOSPITAL_BASED_OUTPATIENT_CLINIC_OR_DEPARTMENT_OTHER): Payer: Self-pay

## 2024-05-19 MED ORDER — OXYCODONE HCL 15 MG PO TABS
15.0000 mg | ORAL_TABLET | Freq: Four times a day (QID) | ORAL | 0 refills | Status: AC | PRN
  Filled 2024-05-19 – 2024-05-20 (×2): qty 120, 30d supply, fill #0

## 2024-05-19 MED ORDER — ALPRAZOLAM 0.5 MG PO TABS
0.5000 mg | ORAL_TABLET | Freq: Three times a day (TID) | ORAL | 1 refills | Status: AC | PRN
  Filled 2024-05-19 – 2024-05-20 (×2): qty 90, 30d supply, fill #0
  Filled 2024-06-16 (×2): qty 90, 30d supply, fill #1

## 2024-05-20 ENCOUNTER — Other Ambulatory Visit (HOSPITAL_BASED_OUTPATIENT_CLINIC_OR_DEPARTMENT_OTHER): Payer: Self-pay

## 2024-05-28 ENCOUNTER — Other Ambulatory Visit (HOSPITAL_BASED_OUTPATIENT_CLINIC_OR_DEPARTMENT_OTHER): Payer: Self-pay

## 2024-05-28 MED ORDER — AZITHROMYCIN 250 MG PO TABS
ORAL_TABLET | ORAL | 0 refills | Status: AC
  Filled 2024-05-28: qty 6, 5d supply, fill #0

## 2024-06-15 ENCOUNTER — Other Ambulatory Visit (HOSPITAL_BASED_OUTPATIENT_CLINIC_OR_DEPARTMENT_OTHER): Payer: Self-pay

## 2024-06-15 MED ORDER — AMOXICILLIN-POT CLAVULANATE 500-125 MG PO TABS
1.0000 | ORAL_TABLET | Freq: Two times a day (BID) | ORAL | 0 refills | Status: AC
  Filled 2024-06-15: qty 14, 7d supply, fill #0

## 2024-06-16 ENCOUNTER — Other Ambulatory Visit (HOSPITAL_BASED_OUTPATIENT_CLINIC_OR_DEPARTMENT_OTHER): Payer: Self-pay

## 2024-06-16 MED ORDER — TIZANIDINE HCL 6 MG PO CAPS
6.0000 mg | ORAL_CAPSULE | Freq: Three times a day (TID) | ORAL | 0 refills | Status: AC
  Filled 2024-06-16: qty 90, 30d supply, fill #0

## 2024-06-17 ENCOUNTER — Other Ambulatory Visit (HOSPITAL_BASED_OUTPATIENT_CLINIC_OR_DEPARTMENT_OTHER): Payer: Self-pay

## 2024-06-29 ENCOUNTER — Other Ambulatory Visit (HOSPITAL_BASED_OUTPATIENT_CLINIC_OR_DEPARTMENT_OTHER): Payer: Self-pay

## 2024-06-29 ENCOUNTER — Other Ambulatory Visit: Payer: Self-pay

## 2024-06-29 MED ORDER — MOUNJARO 12.5 MG/0.5ML ~~LOC~~ SOAJ
12.5000 mg | SUBCUTANEOUS | 0 refills | Status: AC
  Filled 2024-06-29: qty 2, 28d supply, fill #0

## 2024-07-13 ENCOUNTER — Other Ambulatory Visit (HOSPITAL_BASED_OUTPATIENT_CLINIC_OR_DEPARTMENT_OTHER): Payer: Self-pay

## 2024-07-14 ENCOUNTER — Other Ambulatory Visit: Payer: Self-pay

## 2024-07-14 ENCOUNTER — Other Ambulatory Visit (HOSPITAL_BASED_OUTPATIENT_CLINIC_OR_DEPARTMENT_OTHER): Payer: Self-pay

## 2024-07-14 MED ORDER — ALPRAZOLAM 0.25 MG PO TABS
0.2500 mg | ORAL_TABLET | Freq: Three times a day (TID) | ORAL | 0 refills | Status: DC | PRN
  Filled 2024-07-14 – 2024-07-15 (×2): qty 90, 30d supply, fill #0

## 2024-07-14 MED ORDER — OXYCODONE HCL 15 MG PO TABS
15.0000 mg | ORAL_TABLET | Freq: Every day | ORAL | 0 refills | Status: AC | PRN
  Filled 2024-07-14: qty 150, 38d supply, fill #0
  Filled 2024-07-15: qty 150, 30d supply, fill #0

## 2024-07-14 MED ORDER — TIZANIDINE HCL 6 MG PO CAPS
6.0000 mg | ORAL_CAPSULE | Freq: Three times a day (TID) | ORAL | 0 refills | Status: AC | PRN
  Filled 2024-07-14: qty 90, 30d supply, fill #0

## 2024-07-15 ENCOUNTER — Other Ambulatory Visit (HOSPITAL_BASED_OUTPATIENT_CLINIC_OR_DEPARTMENT_OTHER): Payer: Self-pay

## 2024-07-27 ENCOUNTER — Other Ambulatory Visit: Payer: Self-pay

## 2024-07-27 ENCOUNTER — Other Ambulatory Visit (HOSPITAL_BASED_OUTPATIENT_CLINIC_OR_DEPARTMENT_OTHER): Payer: Self-pay

## 2024-07-27 MED ORDER — MOUNJARO 15 MG/0.5ML ~~LOC~~ SOAJ
15.0000 mg | SUBCUTANEOUS | 0 refills | Status: DC
  Filled 2024-07-27: qty 2, 28d supply, fill #0

## 2024-08-10 ENCOUNTER — Other Ambulatory Visit (HOSPITAL_BASED_OUTPATIENT_CLINIC_OR_DEPARTMENT_OTHER): Payer: Self-pay

## 2024-08-10 MED ORDER — OXYCODONE HCL 15 MG PO TABS
15.0000 mg | ORAL_TABLET | ORAL | 0 refills | Status: DC
  Filled 2024-08-12: qty 150, 30d supply, fill #0

## 2024-08-10 MED ORDER — ALPRAZOLAM 0.25 MG PO TABS
0.2500 mg | ORAL_TABLET | Freq: Three times a day (TID) | ORAL | 0 refills | Status: DC | PRN
  Filled 2024-08-12: qty 90, 30d supply, fill #0

## 2024-08-11 ENCOUNTER — Other Ambulatory Visit (HOSPITAL_BASED_OUTPATIENT_CLINIC_OR_DEPARTMENT_OTHER): Payer: Self-pay

## 2024-08-12 ENCOUNTER — Other Ambulatory Visit (HOSPITAL_BASED_OUTPATIENT_CLINIC_OR_DEPARTMENT_OTHER): Payer: Self-pay

## 2024-08-21 ENCOUNTER — Other Ambulatory Visit (HOSPITAL_BASED_OUTPATIENT_CLINIC_OR_DEPARTMENT_OTHER): Payer: Self-pay

## 2024-08-21 MED ORDER — MOUNJARO 15 MG/0.5ML ~~LOC~~ SOAJ
15.0000 mg | SUBCUTANEOUS | 0 refills | Status: DC
  Filled 2024-08-21: qty 2, 28d supply, fill #0

## 2024-09-04 ENCOUNTER — Other Ambulatory Visit (HOSPITAL_BASED_OUTPATIENT_CLINIC_OR_DEPARTMENT_OTHER): Payer: Self-pay

## 2024-09-04 MED ORDER — GABAPENTIN 400 MG PO CAPS
400.0000 mg | ORAL_CAPSULE | Freq: Two times a day (BID) | ORAL | 0 refills | Status: AC
  Filled 2024-09-04: qty 60, 30d supply, fill #0

## 2024-09-08 ENCOUNTER — Other Ambulatory Visit (HOSPITAL_BASED_OUTPATIENT_CLINIC_OR_DEPARTMENT_OTHER): Payer: Self-pay

## 2024-09-09 ENCOUNTER — Other Ambulatory Visit (HOSPITAL_BASED_OUTPATIENT_CLINIC_OR_DEPARTMENT_OTHER): Payer: Self-pay

## 2024-09-09 MED ORDER — ALPRAZOLAM 0.25 MG PO TABS
0.2500 mg | ORAL_TABLET | Freq: Three times a day (TID) | ORAL | 0 refills | Status: DC | PRN
  Filled 2024-09-09: qty 90, 30d supply, fill #0

## 2024-09-09 MED ORDER — OXYCODONE HCL 15 MG PO TABS
15.0000 mg | ORAL_TABLET | Freq: Every day | ORAL | 0 refills | Status: DC
  Filled 2024-09-09: qty 150, 30d supply, fill #0

## 2024-09-14 ENCOUNTER — Other Ambulatory Visit (HOSPITAL_BASED_OUTPATIENT_CLINIC_OR_DEPARTMENT_OTHER): Payer: Self-pay

## 2024-09-15 ENCOUNTER — Other Ambulatory Visit (HOSPITAL_BASED_OUTPATIENT_CLINIC_OR_DEPARTMENT_OTHER): Payer: Self-pay

## 2024-09-16 ENCOUNTER — Other Ambulatory Visit (HOSPITAL_COMMUNITY): Payer: Self-pay

## 2024-09-17 ENCOUNTER — Other Ambulatory Visit (HOSPITAL_BASED_OUTPATIENT_CLINIC_OR_DEPARTMENT_OTHER): Payer: Self-pay

## 2024-09-17 MED ORDER — MOUNJARO 15 MG/0.5ML ~~LOC~~ SOAJ
15.0000 mg | SUBCUTANEOUS | 0 refills | Status: DC
  Filled 2024-09-17: qty 2, 28d supply, fill #0

## 2024-10-06 ENCOUNTER — Other Ambulatory Visit (HOSPITAL_BASED_OUTPATIENT_CLINIC_OR_DEPARTMENT_OTHER): Payer: Self-pay

## 2024-10-07 ENCOUNTER — Other Ambulatory Visit (HOSPITAL_BASED_OUTPATIENT_CLINIC_OR_DEPARTMENT_OTHER): Payer: Self-pay

## 2024-10-07 MED ORDER — ALPRAZOLAM 0.25 MG PO TABS
0.2500 mg | ORAL_TABLET | Freq: Three times a day (TID) | ORAL | 1 refills | Status: DC | PRN
  Filled 2024-10-07: qty 90, 30d supply, fill #0
  Filled 2024-11-05: qty 90, 30d supply, fill #1

## 2024-10-07 MED ORDER — OXYCODONE HCL 15 MG PO TABS
15.0000 mg | ORAL_TABLET | Freq: Every day | ORAL | 0 refills | Status: DC
  Filled 2024-10-07: qty 150, 30d supply, fill #0

## 2024-10-08 ENCOUNTER — Other Ambulatory Visit (HOSPITAL_BASED_OUTPATIENT_CLINIC_OR_DEPARTMENT_OTHER): Payer: Self-pay

## 2024-10-08 ENCOUNTER — Encounter (HOSPITAL_BASED_OUTPATIENT_CLINIC_OR_DEPARTMENT_OTHER): Payer: Self-pay | Admitting: Pharmacy Technician

## 2024-10-12 ENCOUNTER — Other Ambulatory Visit (HOSPITAL_BASED_OUTPATIENT_CLINIC_OR_DEPARTMENT_OTHER): Payer: Self-pay

## 2024-10-12 MED ORDER — MOUNJARO 15 MG/0.5ML ~~LOC~~ SOAJ
15.0000 mg | SUBCUTANEOUS | 0 refills | Status: DC
  Filled 2024-10-12: qty 2, 28d supply, fill #0

## 2024-10-12 MED ORDER — TOPIRAMATE 25 MG PO TABS
25.0000 mg | ORAL_TABLET | Freq: Every day | ORAL | 0 refills | Status: DC
  Filled 2024-10-12: qty 30, 30d supply, fill #0

## 2024-10-12 MED ORDER — METFORMIN HCL 850 MG PO TABS
850.0000 mg | ORAL_TABLET | Freq: Two times a day (BID) | ORAL | 0 refills | Status: AC
  Filled 2024-10-12: qty 130, 65d supply, fill #0
  Filled 2024-10-12: qty 50, 25d supply, fill #0

## 2024-11-03 ENCOUNTER — Other Ambulatory Visit (HOSPITAL_BASED_OUTPATIENT_CLINIC_OR_DEPARTMENT_OTHER): Payer: Self-pay

## 2024-11-03 ENCOUNTER — Other Ambulatory Visit: Payer: Self-pay

## 2024-11-04 ENCOUNTER — Other Ambulatory Visit (HOSPITAL_BASED_OUTPATIENT_CLINIC_OR_DEPARTMENT_OTHER): Payer: Self-pay

## 2024-11-04 MED ORDER — OXYCODONE HCL 15 MG PO TABS
15.0000 mg | ORAL_TABLET | Freq: Every day | ORAL | 0 refills | Status: AC
  Filled 2024-11-05: qty 150, 30d supply, fill #0

## 2024-11-05 ENCOUNTER — Other Ambulatory Visit (HOSPITAL_BASED_OUTPATIENT_CLINIC_OR_DEPARTMENT_OTHER): Payer: Self-pay

## 2024-11-18 ENCOUNTER — Other Ambulatory Visit (HOSPITAL_BASED_OUTPATIENT_CLINIC_OR_DEPARTMENT_OTHER): Payer: Self-pay

## 2024-11-18 MED ORDER — MOUNJARO 15 MG/0.5ML ~~LOC~~ SOAJ
15.0000 mg | SUBCUTANEOUS | 0 refills | Status: DC
  Filled 2024-11-18: qty 2, 28d supply, fill #0

## 2024-12-01 ENCOUNTER — Other Ambulatory Visit (HOSPITAL_BASED_OUTPATIENT_CLINIC_OR_DEPARTMENT_OTHER): Payer: Self-pay

## 2024-12-01 MED ORDER — ALPRAZOLAM 0.25 MG PO TABS
0.2500 mg | ORAL_TABLET | Freq: Three times a day (TID) | ORAL | 0 refills | Status: AC | PRN
  Filled 2024-12-03: qty 90, 30d supply, fill #0

## 2024-12-01 MED ORDER — TIZANIDINE HCL 6 MG PO CAPS
6.0000 mg | ORAL_CAPSULE | Freq: Three times a day (TID) | ORAL | 0 refills | Status: AC | PRN
  Filled 2024-12-01: qty 60, 20d supply, fill #0
  Filled 2024-12-01: qty 30, 10d supply, fill #0

## 2024-12-01 MED ORDER — OXYCODONE HCL 15 MG PO TABS
15.0000 mg | ORAL_TABLET | Freq: Every day | ORAL | 0 refills | Status: AC | PRN
  Filled 2024-12-01 – 2024-12-03 (×2): qty 150, 30d supply, fill #0

## 2024-12-02 ENCOUNTER — Other Ambulatory Visit: Payer: Self-pay

## 2024-12-02 ENCOUNTER — Other Ambulatory Visit (HOSPITAL_BASED_OUTPATIENT_CLINIC_OR_DEPARTMENT_OTHER): Payer: Self-pay

## 2024-12-03 ENCOUNTER — Other Ambulatory Visit (HOSPITAL_BASED_OUTPATIENT_CLINIC_OR_DEPARTMENT_OTHER): Payer: Self-pay

## 2024-12-07 ENCOUNTER — Other Ambulatory Visit (HOSPITAL_BASED_OUTPATIENT_CLINIC_OR_DEPARTMENT_OTHER): Payer: Self-pay

## 2024-12-16 ENCOUNTER — Other Ambulatory Visit (HOSPITAL_BASED_OUTPATIENT_CLINIC_OR_DEPARTMENT_OTHER): Payer: Self-pay

## 2024-12-16 MED ORDER — TOPIRAMATE 25 MG PO TABS
ORAL_TABLET | ORAL | 0 refills | Status: AC
  Filled 2024-12-16: qty 63, 28d supply, fill #0

## 2024-12-16 MED ORDER — MOUNJARO 15 MG/0.5ML ~~LOC~~ SOAJ
15.0000 mg | SUBCUTANEOUS | 0 refills | Status: AC
  Filled 2024-12-16: qty 2, 28d supply, fill #0

## 2024-12-16 MED ORDER — METFORMIN HCL 1000 MG PO TABS
1000.0000 mg | ORAL_TABLET | Freq: Two times a day (BID) | ORAL | 0 refills | Status: AC
  Filled 2024-12-16: qty 180, 90d supply, fill #0

## 2024-12-17 ENCOUNTER — Other Ambulatory Visit (HOSPITAL_BASED_OUTPATIENT_CLINIC_OR_DEPARTMENT_OTHER): Payer: Self-pay

## 2024-12-17 MED ORDER — ATORVASTATIN CALCIUM 40 MG PO TABS
40.0000 mg | ORAL_TABLET | Freq: Every day | ORAL | 1 refills | Status: AC
  Filled 2024-12-17: qty 70, 70d supply, fill #0
  Filled 2024-12-17: qty 20, 20d supply, fill #0

## 2024-12-17 MED ORDER — LEVOTHYROXINE SODIUM 125 MCG PO TABS
125.0000 ug | ORAL_TABLET | Freq: Every day | ORAL | 1 refills | Status: AC
  Filled 2024-12-17: qty 90, 90d supply, fill #0
